# Patient Record
Sex: Male | Born: 1945
Health system: Southern US, Community
[De-identification: ages and names within clinical notes are randomized; demographics above are authoritative.]

## PROBLEM LIST (undated history)

## (undated) DIAGNOSIS — J449 Chronic obstructive pulmonary disease, unspecified: Secondary | ICD-10-CM

## (undated) DIAGNOSIS — I10 Essential (primary) hypertension: Secondary | ICD-10-CM

---

## 2016-06-07 ENCOUNTER — Emergency Department (HOSPITAL_COMMUNITY)
Admission: EM | Admit: 2016-06-07 | Discharge: 2016-06-07 | Disposition: A | Discharge status: Home / Self Care | Payer: Medicare Other | Attending: Emergency Medicine | Admitting: Emergency Medicine

## 2016-06-07 ENCOUNTER — Encounter (HOSPITAL_COMMUNITY): Payer: Self-pay | Admitting: Emergency Medicine

## 2016-06-07 DIAGNOSIS — I10 Essential (primary) hypertension: Secondary | ICD-10-CM | POA: Insufficient documentation

## 2016-06-07 DIAGNOSIS — Z87891 Personal history of nicotine dependence: Secondary | ICD-10-CM | POA: Diagnosis not present

## 2016-06-07 DIAGNOSIS — Z7982 Long term (current) use of aspirin: Secondary | ICD-10-CM | POA: Insufficient documentation

## 2016-06-07 DIAGNOSIS — J449 Chronic obstructive pulmonary disease, unspecified: Secondary | ICD-10-CM | POA: Diagnosis not present

## 2016-06-07 DIAGNOSIS — R040 Epistaxis: Secondary | ICD-10-CM | POA: Diagnosis not present

## 2016-06-07 DIAGNOSIS — Z79899 Other long term (current) drug therapy: Secondary | ICD-10-CM | POA: Diagnosis not present

## 2016-06-07 HISTORY — DX: Chronic obstructive pulmonary disease, unspecified: J44.9

## 2016-06-07 HISTORY — DX: Essential (primary) hypertension: I10

## 2016-06-07 MED ORDER — OXYMETAZOLINE HCL 0.05 % NA SOLN
1.0000 | Freq: Once | NASAL | Status: AC
  Administered 2016-06-07: 1 via NASAL
  Filled 2016-06-07: qty 15

## 2016-06-07 NOTE — ED Notes (Signed)
Pt made aware to return if symptoms worsen or if any life threatening symptoms occur.   

## 2016-06-07 NOTE — ED Triage Notes (Signed)
Per EMS: Pt reports nosebleed from left nare secondary to high bp starting 2 hours ago, pt wears 02 at home (3L). Pt usually takes 81mg  asa daily, but has not taken it in a week.  Pt has not taken bp medication today.  Denies pain. 207/97, 195/93, 99%, 76hr

## 2016-06-07 NOTE — Discharge Instructions (Signed)
Patient has begins to bleed again, apply constant pressure for 15-20 minutes and use 1 spray of Afrin. Please follow-up with your primary care provider about your recurrent nosebleeds. Make sure to use humidified air with your oxygen. Please return to the emergency department if you develop any new or worsening symptoms, or if you are unable to stop your nose from bleeding again.

## 2016-06-07 NOTE — ED Provider Notes (Signed)
[facility] DEPT Provider Note   CSN: [id number] Arrival date & time: 06/07/16  0800     History   Chief Complaint Chief Complaint  Patient presents with  . Epistaxis    HPI Jason Harvey is a 71 y.o. male history of COPD and hypertension who is on 2 L of home oxygen who presents with a two-hour history of intermittent epistaxis. Patient reports having epistaxis from his left nare. Patient was able to stop his nose from bleeding temporarily at home, but a clot moved andbegan bleeding again. Patient uses home oxygen and usually has humidified air, however he said the reservoir may have been empty. Patient reports he experiences epistaxis from time to time, but has never had to come to the hospital for it. He denies any pain. Denies any chest pain, new shortness of breath, abdominal pain, nausea vomiting, urinary symptoms.  HPI  Past Medical History:  Diagnosis Date  . COPD (chronic obstructive pulmonary disease) ([place])   . Hypertension     There are no active problems to display for this patient.   History reviewed. No pertinent surgical history.     Home Medications    Prior to Admission medications   Medication Sig Start Date End Date Taking? Authorizing Provider  amLODipine (NORVASC) 5 MG tablet Take 5 mg by mouth daily.   Yes Historical Provider, MD  aspirin EC 81 MG tablet Take 81 mg by mouth daily.   Yes Historical Provider, MD  azelastine (ASTELIN) 0.1 % nasal spray Place 1 spray into both nostrils 2 (two) times daily. Use in each nostril as directed   Yes Historical Provider, MD  budesonide-formoterol (SYMBICORT) 80-4.5 MCG/ACT inhaler Inhale 2 puffs into the lungs 2 (two) times daily.   Yes Historical Provider, MD  cloNIDine (CATAPRES) 0.3 MG tablet Take 0.3 mg by mouth 2 (two) times daily.   Yes Historical Provider, MD  latanoprost (XALATAN) 0.005 % ophthalmic solution Place 1 drop into both eyes at bedtime.   Yes Historical Provider, MD  tamsulosin (FLOMAX)  0.4 MG CAPS capsule Take 0.4 mg by mouth at bedtime.   Yes Historical Provider, MD    Family History History reviewed. No pertinent family history.  Social History Social History  Substance Use Topics  . Smoking status: Former Research scientist (life sciences)  . Smokeless tobacco: Not on file  . Alcohol use No     Allergies   Sulfa antibiotics   Review of Systems Review of Systems  Constitutional: Negative for chills and fever.  HENT: Positive for nosebleeds. Negative for facial swelling and sore throat.   Respiratory: Negative for shortness of breath.   Cardiovascular: Negative for chest pain.  Gastrointestinal: Negative for abdominal pain, nausea and vomiting.  Genitourinary: Negative for dysuria.  Musculoskeletal: Negative for back pain.  Skin: Negative for rash and wound.  Neurological: Negative for headaches.  Psychiatric/Behavioral: The patient is not nervous/anxious.      Physical Exam Updated Vital Signs BP 177/94 (BP Location: Left Arm)   Pulse 90   Temp 98.1 F (36.7 C) (Oral)   Resp 18   Ht 6\' 1"  (1.854 m)   Wt 129.3 kg   SpO2 94%   BMI 37.60 kg/m   Physical Exam  Constitutional: He appears well-developed and well-nourished. No distress.  HENT:  Head: Normocephalic and atraumatic.  Nose: Epistaxis (clot to left nare) is observed.  Mouth/Throat: Oropharynx is clear and moist. No oropharyngeal exudate.  Eyes: Conjunctivae are normal. Pupils are equal, round, and reactive to light. Right  eye exhibits no discharge. Left eye exhibits no discharge. No scleral icterus.  Neck: Normal range of motion. Neck supple. No thyromegaly present.  Cardiovascular: Normal rate, regular rhythm, normal heart sounds and intact distal pulses.  Exam reveals no gallop and no friction rub.   No murmur heard. Pulmonary/Chest: Effort normal and breath sounds normal. No stridor. No respiratory distress. He has no wheezes. He has no rales.  Abdominal: Soft. Bowel sounds are normal. He exhibits no  distension. There is no tenderness. There is no rebound and no guarding.  Musculoskeletal: He exhibits no edema.  Lymphadenopathy:    He has no cervical adenopathy.  Neurological: He is alert. Coordination normal.  Skin: Skin is warm and dry. No rash noted. He is not diaphoretic. No pallor.  Psychiatric: He has a normal mood and affect.  Nursing note and vitals reviewed.    ED Treatments / Results  Labs (all labs ordered are listed, but only abnormal results are displayed) Labs Reviewed - No data to display  EKG  EKG Interpretation None       Radiology No results found.  Procedures Procedures (including critical care time)  Medications Ordered in ED Medications  oxymetazoline (AFRIN) 0.05 % nasal spray 1 spray (1 spray Each Nare Given 06/07/16 0831)     Initial Impression / Assessment and Plan / ED Course  I have reviewed the triage vital signs and the nursing notes.  Pertinent labs & imaging results that were available during my care of the patient were reviewed by me and considered in my medical decision making (see chart for details).     Patient presenting with left-sided epistaxis. No active bleeding in the ED. I directed patient to blow his nose and a large clot was expressed. Afrin spray applied. No continued active bleeding. Patient discharged home with Afrin and directions to use in the future if nosebleeds began again. Patient advised not to use repetitively. Patient to follow up with PCP to discuss today's visit. Patient advised to make sure to use humidified air with home oxygen. Return precautions discussed. Patient understands and agrees with plan. Patient discharged in satisfactory condition without complaints. I discussed patient case with Dr. Thurnell Garbe who guided the patient's management and agrees with plan.  Final Clinical Impressions(s) / ED Diagnoses   Final diagnoses:  Epistaxis    New Prescriptions New Prescriptions   No medications on file      Frederica Kuster, Hershal Coria 06/07/16 Shafer, DO 06/09/16 1827

## 2017-03-25 ENCOUNTER — Ambulatory Visit: Payer: Medicare Other | Admitting: Cardiovascular Disease

## 2017-03-25 ENCOUNTER — Encounter: Payer: Self-pay | Admitting: Cardiovascular Disease

## 2017-03-25 ENCOUNTER — Encounter: Payer: Self-pay | Admitting: *Deleted

## 2017-03-25 ENCOUNTER — Telehealth: Payer: Self-pay | Admitting: Cardiovascular Disease

## 2017-03-25 VITALS — BP 160/58 | HR 60 | Ht 73.0 in | Wt 287.0 lb

## 2017-03-25 DIAGNOSIS — D696 Thrombocytopenia, unspecified: Secondary | ICD-10-CM | POA: Diagnosis not present

## 2017-03-25 DIAGNOSIS — I10 Essential (primary) hypertension: Secondary | ICD-10-CM

## 2017-03-25 DIAGNOSIS — R0609 Other forms of dyspnea: Secondary | ICD-10-CM | POA: Diagnosis not present

## 2017-03-25 DIAGNOSIS — R9431 Abnormal electrocardiogram [ECG] [EKG]: Secondary | ICD-10-CM | POA: Diagnosis not present

## 2017-03-25 DIAGNOSIS — N183 Chronic kidney disease, stage 3 unspecified: Secondary | ICD-10-CM

## 2017-03-25 DIAGNOSIS — D649 Anemia, unspecified: Secondary | ICD-10-CM

## 2017-03-25 NOTE — Progress Notes (Signed)
CARDIOLOGY CONSULT NOTE  Patient ID: Jason Harvey MRN: [id number] DOB/AGE: 01/24/1946 70 y.o.  Admit date: (Not on file) Primary Physician: Monico Blitz, MD Referring Physician: Dr. Manuella Ghazi  Reason for Consultation: Exertional dyspnea  HPI: Jason Harvey is a 71 y.o. male who is being seen today for the evaluation of exertional dyspnea at the request of Monico Blitz, MD.   He has a history of hypertension, oxygen dependent COPD, and bilateral leg edema.  I reviewed all records from his PCP including labs and studies.  Labs 02/24/17: BUN 25, creatinine 1.98, sodium 140, potassium 4.6, albumin 3.2, hemoglobin 9.2, platelets 64, white blood cells 9.2, ferritin 42, iron 34.  ECG on 03/06/17 demonstrated sinus rhythm with right bundle branch block and PVCs.  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  He apparently underwent an echocardiogram about 2 weeks ago but I do not have a copy of this report.  He was told he had some mild valve leakage due to age.  He has had some increasing exertional dyspnea over the past year.  He has occasional palpitations.  He denies chest pain.  I reviewed his home blood pressure log which demonstrate blood pressures ranging in the 120-130/60-70 range.  He does not exercise.  He takes care of his wife full-time.  He has not smoked cigarettes for the past 9 years.  He has bilateral leg edema which began in June.  He wears compression stockings.  He said he underwent a colonoscopy about 3 years ago and had 1 polyp removed.   Allergies  Allergen Reactions  . Sulfa Antibiotics Anxiety    Current Outpatient Medications  Medication Sig Dispense Refill  . amLODipine (NORVASC) 5 MG tablet Take 5 mg by mouth daily.    Marland Kitchen aspirin EC 81 MG tablet Take 81 mg by mouth daily.    Marland Kitchen azelastine (ASTELIN) 0.1 % nasal spray Place 1 spray into both nostrils 2 (two) times daily. Use in each nostril as directed      . budesonide-formoterol (SYMBICORT) 80-4.5 MCG/ACT inhaler Inhale 2 puffs into the lungs 2 (two) times daily.    . cloNIDine (CATAPRES) 0.3 MG tablet Take 0.3 mg by mouth 2 (two) times daily.    . furosemide (LASIX) 20 MG tablet Take 20 mg by mouth.    . hydrALAZINE (APRESOLINE) 25 MG tablet Take 25 mg 2 (two) times daily by mouth.     . latanoprost (XALATAN) 0.005 % ophthalmic solution Place 1 drop into both eyes at bedtime.    . potassium chloride (K-DUR) 10 MEQ tablet Take 10 mEq daily by mouth.    . tamsulosin (FLOMAX) 0.4 MG CAPS capsule Take 0.4 mg by mouth at bedtime.    Marland Kitchen UNABLE TO FIND Med Name:3L O2    . Vitamin D, Ergocalciferol, (DRISDOL) 50000 units CAPS capsule Take 50,000 Units every 7 (seven) days by mouth.     No current facility-administered medications for this visit.     Past Medical History:  Diagnosis Date  . COPD (chronic obstructive pulmonary disease) ([place])   . Hypertension     History reviewed. No pertinent surgical history.  Social History   Socioeconomic History  . Marital status: Married    Spouse name: Not on file  . Number of children: Not on file  . Years of education: Not on file  . Highest education level: Not on file  Social Needs  .  Financial resource strain: Not on file  . Food insecurity - worry: Not on file  . Food insecurity - inability: Not on file  . Transportation needs - medical: Not on file  . Transportation needs - non-medical: Not on file  Occupational History  . Not on file  Tobacco Use  . Smoking status: Former Research scientist (life sciences)  . Smokeless tobacco: Never Used  Substance and Sexual Activity  . Alcohol use: No  . Drug use: No  . Sexual activity: Not on file  Other Topics Concern  . Not on file  Social History Narrative  . Not on file     No family history of premature CAD in 1st degree relatives.  Current Meds  Medication Sig  . amLODipine (NORVASC) 5 MG tablet Take 5 mg by mouth daily.  Marland Kitchen aspirin EC 81 MG tablet Take 81  mg by mouth daily.  Marland Kitchen azelastine (ASTELIN) 0.1 % nasal spray Place 1 spray into both nostrils 2 (two) times daily. Use in each nostril as directed  . budesonide-formoterol (SYMBICORT) 80-4.5 MCG/ACT inhaler Inhale 2 puffs into the lungs 2 (two) times daily.  . cloNIDine (CATAPRES) 0.3 MG tablet Take 0.3 mg by mouth 2 (two) times daily.  . furosemide (LASIX) 20 MG tablet Take 20 mg by mouth.  . hydrALAZINE (APRESOLINE) 25 MG tablet Take 25 mg 2 (two) times daily by mouth.   . latanoprost (XALATAN) 0.005 % ophthalmic solution Place 1 drop into both eyes at bedtime.  . potassium chloride (K-DUR) 10 MEQ tablet Take 10 mEq daily by mouth.  . tamsulosin (FLOMAX) 0.4 MG CAPS capsule Take 0.4 mg by mouth at bedtime.  Marland Kitchen UNABLE TO FIND Med Name:3L O2  . Vitamin D, Ergocalciferol, (DRISDOL) 50000 units CAPS capsule Take 50,000 Units every 7 (seven) days by mouth.      Review of systems complete and found to be negative unless listed above in HPI    Physical exam Blood pressure (!) 160/58, pulse 60, height 6\' 1"  (1.854 m), weight 287 lb (130.2 kg), SpO2 98 %. General: NAD, using oxygen by nasal cannula. Neck: No JVD, no thyromegaly or thyroid nodule.  Lungs: Diminished throughout, no crackles or wheezes CV: Nondisplaced PMI. Regular rate and rhythm, normal S1/S2, no S3/S4, no murmur.  Bilateral lower extremity edema.  He is wearing compression stockings. Abdomen: Soft, nontender, no distention.  Skin: Intact without lesions or rashes.  Neurologic: Alert and oriented x 3.  Psych: Normal affect. Extremities: No clubbing or cyanosis.  HEENT: Normal.   ECG: Most recent ECG reviewed.   Labs: No results found for: K, BUN, CREATININE, ALT, TSH, HGB   Lipids: No results found for: LDLCALC, LDLDIRECT, CHOL, TRIG, HDL      ASSESSMENT AND PLAN:  1.  Exertional dyspnea and leg edema with abnormal ECG: He is both anemic and thrombocytopenic.  Exertional dyspnea may be related to his anemia.  I do  not know what his labs were 1 year ago to demonstrate a trend.  He apparently underwent a colonoscopy 3 years ago with no significant findings other than a polyp. He underwent an echocardiogram and I will try to obtain this report. I will obtain a Lexiscan Myoview stress test to assess for ischemic heart disease.  2.  Hypertension: Elevated in our office but blood pressure control at home is normal.  No changes to therapy.  3.  Anemia and thrombocytopenia: I am not certain if a peripheral smear was obtained.  I will defer further workup  to PCP.  He also has chronic kidney disease.   Disposition: Follow up in 2 months  Signed: Kate Sable, M.D., F.A.C.C.  03/25/2017, 10:14 AM

## 2017-03-25 NOTE — Telephone Encounter (Signed)
lexiscan - Jason Harvey, abnl ekg Scheduled at [facility] on Apr 02, 2017 arrive at [facility]

## 2017-03-25 NOTE — Patient Instructions (Signed)
Medication Instructions:  Continue all current medications.  Labwork: none  Testing/Procedures:  Your physician has requested that you have a lexiscan myoview. For further information please visit [email or web address]. Please follow instruction sheet, as given.  Office will contact with results via phone or letter.    Follow-Up: 2 months   Any Other Special Instructions Will Be Listed Below (If Applicable).  If you need a refill on your cardiac medications before your next appointment, please call your pharmacy.

## 2017-04-02 ENCOUNTER — Encounter (HOSPITAL_BASED_OUTPATIENT_CLINIC_OR_DEPARTMENT_OTHER)
Admission: RE | Admit: 2017-04-02 | Discharge: 2017-04-02 | Disposition: A | Discharge status: Home / Self Care | Payer: Medicare Other | Source: Ambulatory Visit | Attending: Cardiovascular Disease | Admitting: Cardiovascular Disease

## 2017-04-02 ENCOUNTER — Encounter (HOSPITAL_COMMUNITY)
Admission: RE | Admit: 2017-04-02 | Discharge: 2017-04-02 | Disposition: A | Discharge status: Home / Self Care | Payer: Medicare Other | Source: Ambulatory Visit | Attending: Cardiovascular Disease | Admitting: Cardiovascular Disease

## 2017-04-02 ENCOUNTER — Encounter (HOSPITAL_COMMUNITY): Payer: Self-pay

## 2017-04-02 ENCOUNTER — Telehealth: Payer: Self-pay | Admitting: *Deleted

## 2017-04-02 DIAGNOSIS — R9431 Abnormal electrocardiogram [ECG] [EKG]: Secondary | ICD-10-CM | POA: Insufficient documentation

## 2017-04-02 DIAGNOSIS — R0609 Other forms of dyspnea: Secondary | ICD-10-CM | POA: Diagnosis present

## 2017-04-02 LAB — NM MYOCAR MULTI W/SPECT W/WALL MOTION / EF
CSEPPHR: 93 {beats}/min
LVDIAVOL: 148 mL (ref 62–150)
LVSYSVOL: 69 mL
RATE: 0.38
Rest HR: 60 {beats}/min
SDS: 0
SRS: 1
SSS: 1
TID: 0.88

## 2017-04-02 MED ORDER — TECHNETIUM TC 99M TETROFOSMIN IV KIT
30.0000 | PACK | Freq: Once | INTRAVENOUS | Status: AC | PRN
  Administered 2017-04-02: 32 via INTRAVENOUS

## 2017-04-02 MED ORDER — TECHNETIUM TC 99M TETROFOSMIN IV KIT
10.0000 | PACK | Freq: Once | INTRAVENOUS | Status: AC | PRN
  Administered 2017-04-02: 9.9 via INTRAVENOUS

## 2017-04-02 MED ORDER — SODIUM CHLORIDE 0.9% FLUSH
INTRAVENOUS | Status: AC
  Administered 2017-04-02: 10 mL via INTRAVENOUS
  Filled 2017-04-02: qty 10

## 2017-04-02 MED ORDER — REGADENOSON 0.4 MG/5ML IV SOLN
INTRAVENOUS | Status: AC
  Administered 2017-04-02: 0.4 mg via INTRAVENOUS
  Filled 2017-04-02: qty 5

## 2017-04-02 NOTE — Telephone Encounter (Signed)
Notes recorded by Laurine Blazer, LPN on 68/04/7516 at 3:47 PM EST Patient notified. Copy to pmd. Follow up scheduled for 05/28/2017 with Dr. Bronson Ing. ------  Notes recorded by Herminio Commons, MD on 04/02/2017 at 12:34 PM EST Low risk for blockages.

## 2017-05-28 ENCOUNTER — Ambulatory Visit: Payer: Medicare Other | Admitting: Cardiovascular Disease

## 2017-05-28 ENCOUNTER — Encounter: Payer: Self-pay | Admitting: Cardiovascular Disease

## 2017-05-28 VITALS — BP 138/48 | HR 51 | Ht 73.0 in | Wt 266.0 lb

## 2017-05-28 DIAGNOSIS — R0609 Other forms of dyspnea: Secondary | ICD-10-CM | POA: Diagnosis not present

## 2017-05-28 DIAGNOSIS — D649 Anemia, unspecified: Secondary | ICD-10-CM | POA: Diagnosis not present

## 2017-05-28 DIAGNOSIS — R9431 Abnormal electrocardiogram [ECG] [EKG]: Secondary | ICD-10-CM | POA: Diagnosis not present

## 2017-05-28 DIAGNOSIS — I10 Essential (primary) hypertension: Secondary | ICD-10-CM

## 2017-05-28 NOTE — Progress Notes (Signed)
SUBJECTIVE: The patient returns for follow-up after undergoing cardiovascular testing performed for the evaluation of exertional dyspnea.  Nuclear stress test on 04/02/17 was low risk, LVEF 54%.  Echocardiogram on 03/03/17 demonstrated normal left ventricular systolic function and regional wall motion, LVEF 84-69%, with diastolic dysfunction.  He denies chest pain.  Exertional dyspnea is stable.  He was recently hospitalized for anemia.  He said his hemoglobin was 7.1 and he received 2 units packed red blood cell transfusion.  He has been wrapping his legs and elevating them at home and edema is subsiding.  He showed me a device his son brought him for Christmas which records your ECG Jodelle Red).    Review of Systems: As per "subjective", otherwise negative.  Allergies  Allergen Reactions  . Sulfa Antibiotics Anxiety    Current Outpatient Medications  Medication Sig Dispense Refill  . amLODipine (NORVASC) 5 MG tablet Take 5 mg by mouth daily.    Marland Kitchen aspirin EC 81 MG tablet Take 81 mg by mouth daily.    Marland Kitchen azelastine (ASTELIN) 0.1 % nasal spray Place 1 spray into both nostrils 2 (two) times daily. Use in each nostril as directed    . budesonide-formoterol (SYMBICORT) 80-4.5 MCG/ACT inhaler Inhale 2 puffs into the lungs 2 (two) times daily.    . cloNIDine (CATAPRES) 0.3 MG tablet Take 0.3 mg by mouth 2 (two) times daily.    . furosemide (LASIX) 20 MG tablet Take 20 mg by mouth.    . hydrALAZINE (APRESOLINE) 25 MG tablet Take 25 mg 2 (two) times daily by mouth.     . latanoprost (XALATAN) 0.005 % ophthalmic solution Place 1 drop into both eyes at bedtime.    . OXYGEN Inhale into the lungs. 3 Liters    . potassium chloride (K-DUR) 10 MEQ tablet Take 10 mEq daily by mouth.    . tamsulosin (FLOMAX) 0.4 MG CAPS capsule Take 0.4 mg by mouth at bedtime.    Marland Kitchen UNABLE TO FIND Med Name:3L O2    . Vitamin D, Ergocalciferol, (DRISDOL) 50000 units CAPS capsule Take 50,000 Units every 7 (seven)  days by mouth.     No current facility-administered medications for this visit.     Past Medical History:  Diagnosis Date  . COPD (chronic obstructive pulmonary disease) ([place])   . Hypertension     No past surgical history on file.  Social History   Socioeconomic History  . Marital status: Married    Spouse name: Not on file  . Number of children: Not on file  . Years of education: Not on file  . Highest education level: Not on file  Social Needs  . Financial resource strain: Not on file  . Food insecurity - worry: Not on file  . Food insecurity - inability: Not on file  . Transportation needs - medical: Not on file  . Transportation needs - non-medical: Not on file  Occupational History  . Not on file  Tobacco Use  . Smoking status: Former Research scientist (life sciences)  . Smokeless tobacco: Never Used  Substance and Sexual Activity  . Alcohol use: No  . Drug use: No  . Sexual activity: Not on file  Other Topics Concern  . Not on file  Social History Narrative  . Not on file     Vitals:   05/28/17 0822  BP: (!) 138/48  Pulse: (!) 51  SpO2: 98%  Weight: 266 lb (120.7 kg)  Height: 6\' 1"  (1.854 m)  Wt Readings from Last 3 Encounters:  05/28/17 266 lb (120.7 kg)  03/25/17 287 lb (130.2 kg)  06/07/16 285 lb (129.3 kg)     PHYSICAL EXAM General: NAD HEENT: Normal. Neck: No JVD, no thyromegaly. Lungs: Diminished throughout, no crackles or wheezes. CV: Regular rate and rhythm, normal S1/S2, no S3/S4, no murmur.  Legs are wrapped. Abdomen: Soft, nontender, no distention.  Neurologic: Alert and oriented.  Psych: Normal affect. Skin: Normal. Musculoskeletal: No gross deformities.    ECG: Most recent ECG reviewed.   Labs: No results found for: K, BUN, CREATININE, ALT, TSH, HGB   Lipids: No results found for: LDLCALC, LDLDIRECT, CHOL, TRIG, HDL     ASSESSMENT AND PLAN:  1.  Exertional dyspnea and leg edema with abnormal ECG: Nuclear stress test was low risk as  detailed above.  Left ventricular systolic function was normal.  No further cardiac testing is indicated at this time.  2.  Hypertension: Controlled.  No change to therapy.   Disposition: Follow up prn   Kate Sable, M.D., F.A.C.C.

## 2017-05-28 NOTE — Patient Instructions (Signed)
Medication Instructions:  Continue all current medications.  Labwork: none  Testing/Procedures: none  Follow-Up: As needed.    Any Other Special Instructions Will Be Listed Below (If Applicable).  If you need a refill on your cardiac medications before your next appointment, please call your pharmacy.  

## 2017-06-24 ENCOUNTER — Encounter: Payer: Self-pay | Admitting: Internal Medicine

## 2017-07-25 ENCOUNTER — Other Ambulatory Visit (HOSPITAL_COMMUNITY): Payer: Self-pay | Admitting: Oncology

## 2017-07-25 DIAGNOSIS — C155 Malignant neoplasm of lower third of esophagus: Secondary | ICD-10-CM

## 2017-08-05 ENCOUNTER — Ambulatory Visit (HOSPITAL_COMMUNITY)
Admission: RE | Admit: 2017-08-05 | Discharge: 2017-08-05 | Disposition: A | Discharge status: Home / Self Care | Payer: Medicare Other | Source: Ambulatory Visit | Attending: Oncology | Admitting: Oncology

## 2017-08-05 DIAGNOSIS — C155 Malignant neoplasm of lower third of esophagus: Secondary | ICD-10-CM

## 2017-08-05 DIAGNOSIS — R918 Other nonspecific abnormal finding of lung field: Secondary | ICD-10-CM | POA: Diagnosis not present

## 2017-08-05 LAB — GLUCOSE, CAPILLARY: Glucose-Capillary: 128 mg/dL — ABNORMAL HIGH (ref 65–99)

## 2017-08-05 MED ORDER — FLUDEOXYGLUCOSE F - 18 (FDG) INJECTION
13.4300 | Freq: Once | INTRAVENOUS | Status: AC | PRN
  Administered 2017-08-05: 13.43 via INTRAVENOUS

## 2017-08-07 ENCOUNTER — Ambulatory Visit: Payer: Medicare Other | Admitting: Nurse Practitioner

## 2018-06-09 ENCOUNTER — Emergency Department (HOSPITAL_COMMUNITY)
Admission: EM | Admit: 2018-06-09 | Discharge: 2018-06-09 | Disposition: A | Discharge status: Home / Self Care | Payer: Medicare Other | Attending: Emergency Medicine | Admitting: Emergency Medicine

## 2018-06-09 ENCOUNTER — Encounter (HOSPITAL_COMMUNITY): Payer: Self-pay | Admitting: Emergency Medicine

## 2018-06-09 ENCOUNTER — Other Ambulatory Visit: Payer: Self-pay

## 2018-06-09 DIAGNOSIS — Z87891 Personal history of nicotine dependence: Secondary | ICD-10-CM | POA: Diagnosis not present

## 2018-06-09 DIAGNOSIS — Z7982 Long term (current) use of aspirin: Secondary | ICD-10-CM | POA: Insufficient documentation

## 2018-06-09 DIAGNOSIS — I10 Essential (primary) hypertension: Secondary | ICD-10-CM | POA: Diagnosis not present

## 2018-06-09 DIAGNOSIS — J449 Chronic obstructive pulmonary disease, unspecified: Secondary | ICD-10-CM | POA: Diagnosis not present

## 2018-06-09 DIAGNOSIS — R040 Epistaxis: Secondary | ICD-10-CM | POA: Diagnosis present

## 2018-06-09 DIAGNOSIS — Z79899 Other long term (current) drug therapy: Secondary | ICD-10-CM | POA: Diagnosis not present

## 2018-06-09 MED ORDER — CEPHALEXIN 500 MG PO CAPS: 500.0000 mg | ORAL_CAPSULE | Freq: Two times a day (BID) | ORAL | 0 refills | Status: DC

## 2018-06-09 MED ORDER — OXYMETAZOLINE HCL 0.05 % NA SOLN
1.0000 | Freq: Once | NASAL | Status: AC
  Administered 2018-06-09: 1 via NASAL
  Filled 2018-06-09: qty 15

## 2018-06-09 NOTE — ED Provider Notes (Signed)
[facility] EMERGENCY DEPARTMENT Provider Note   CSN: [id number] Arrival date & time: 06/09/18  0101     History   Chief Complaint No chief complaint on file.   HPI Jason Harvey is a 73 y.o. male.  Patient presents to the emergency department with a chief complaint of epistaxis.  He states the symptoms started about 8 PM tonight.  He states that the bleeding has been coming from his left nostril.  He denies any trauma.  He reports having had nosebleeds similar to this in the past.  He has never required nasal packing.  He is not anticoagulated.  He has tried using nose sprays prior to arrival with no relief.  The history is provided by the patient. No language interpreter was used.    Past Medical History:  Diagnosis Date  . COPD (chronic obstructive pulmonary disease) ([place])   . Hypertension     There are no active problems to display for this patient.   History reviewed. No pertinent surgical history.      Home Medications    Prior to Admission medications   Medication Sig Start Date End Date Taking? Authorizing Provider  amLODipine (NORVASC) 5 MG tablet Take 5 mg by mouth daily.    [provider]  aspirin EC 81 MG tablet Take 81 mg by mouth daily.    [provider]  azelastine (ASTELIN) 0.1 % nasal spray Place 1 spray into both nostrils 2 (two) times daily. Use in each nostril as directed    [provider]  budesonide-formoterol (SYMBICORT) 80-4.5 MCG/ACT inhaler Inhale 2 puffs into the lungs 2 (two) times daily.    [provider]  cloNIDine (CATAPRES) 0.3 MG tablet Take 0.3 mg by mouth 2 (two) times daily.    [provider]  furosemide (LASIX) 20 MG tablet Take 20 mg by mouth.    [provider]  hydrALAZINE (APRESOLINE) 25 MG tablet Take 25 mg 2 (two) times daily by mouth.     [provider]  latanoprost (XALATAN) 0.005 % ophthalmic solution Place 1 drop into both eyes at  bedtime.    [provider]  OXYGEN Inhale into the lungs. 3 Liters    [provider]  potassium chloride (K-DUR) 10 MEQ tablet Take 10 mEq daily by mouth.    [provider]  tamsulosin (FLOMAX) 0.4 MG CAPS capsule Take 0.4 mg by mouth at bedtime.    [provider]  UNABLE TO FIND Med Name:3L O2    [provider]  Vitamin D, Ergocalciferol, (DRISDOL) 50000 units CAPS capsule Take 50,000 Units every 7 (seven) days by mouth.    [provider]    Family History Family History  Problem Relation Age of Onset  . Hypertension Father     Social History Social History   Tobacco Use  . Smoking status: Former Research scientist (life sciences)  . Smokeless tobacco: Never Used  Substance Use Topics  . Alcohol use: No  . Drug use: No     Allergies   Sulfa antibiotics   Review of Systems Review of Systems  All other systems reviewed and are negative.    Physical Exam Updated Vital Signs BP (!) 179/86   Pulse 91   Temp 98.4 F (36.9 C) (Oral)   Resp 18   Ht 6\' 1"  (1.854 m)   Wt 112 kg   SpO2 97%   BMI 32.59 kg/m   Physical Exam Vitals signs and nursing note  reviewed.  Constitutional:      Appearance: He is well-developed.  HENT:     Head: Normocephalic and atraumatic.     Nose:     Comments: Bleeding from left nostril, source not well visualized due to bleeding Eyes:     Conjunctiva/sclera: Conjunctivae normal.  Neck:     Musculoskeletal: Normal range of motion.  Cardiovascular:     Rate and Rhythm: Normal rate.  Pulmonary:     Effort: Pulmonary effort is normal.  Abdominal:     General: There is no distension.  Musculoskeletal: Normal range of motion.  Skin:    General: Skin is dry.  Neurological:     Mental Status: He is alert and oriented to person, place, and time.  Psychiatric:        Behavior: Behavior normal.        Thought Content: Thought content normal.        Judgment: Judgment normal.      ED Treatments /  Results  Labs (all labs ordered are listed, but only abnormal results are displayed) Labs Reviewed - No data to display  EKG None  Radiology No results found.  Procedures .Epistaxis Management Date/Time: 06/09/2018 1:50 AM Performed by: Montine Circle, PA-C Authorized by: Montine Circle, PA-C   Consent:    Consent obtained:  Verbal   Consent given by:  Patient   Risks discussed:  Bleeding, infection, nasal injury and pain   Alternatives discussed:  Observation Anesthesia (see MAR for exact dosages):    Anesthesia method:  None Procedure details:    Treatment site:  L anterior   Treatment method:  Merocel sponge   Treatment complexity:  Limited   Treatment episode: recurring   Post-procedure details:    Assessment:  Bleeding decreased   Patient tolerance of procedure:  Tolerated well, no immediate complications   (including critical care time)  Medications Ordered in ED Medications  oxymetazoline (AFRIN) 0.05 % nasal spray 1 spray (1 spray Each Nare Given 06/09/18 0117)     Initial Impression / Assessment and Plan / ED Course  I have reviewed the triage vital signs and the nursing notes.  Pertinent labs & imaging results that were available during my care of the patient were reviewed by me and considered in my medical decision making (see chart for details).     Patient with bleeding from his left nares.  Not anticoagulated.  Denies trauma.    Initial treatment with afrin unsuccessful.  Tried packing with rapid rhino, but this had some leaking and dripping down the back of his throat.  I removed this and placed a 10cm merocel packing.    4:11 AM Patient has had persistent very slow dripping of blood down his throat.  I discussed this with ENT Dr. Wilburn Cornelia, who states that likely adequately controlled given that the bleeding is so minor now compared to original presentation.  Recommends follow-up in his office.  Will give keflex.  Patient will call ENT office  this morning.  Patient understands and agrees with the plan.  Final Clinical Impressions(s) / ED Diagnoses   Final diagnoses:  Epistaxis    ED Discharge Orders    None       Montine Circle, PA-C 06/09/18 0415    Ripley Fraise, MD 06/09/18 [phone]

## 2018-06-09 NOTE — Discharge Instructions (Addendum)
Please call the ENT office today for an appointment if the slow dripping remains.  If it stops, please call and make an appointment for in 3 days.  If it worsens, return to the ER.

## 2018-06-09 NOTE — ED Notes (Signed)
ED Provider at bedside. 

## 2018-06-09 NOTE — ED Provider Notes (Signed)
Patient seen/examined in the Emergency Department in conjunction with Midlevel Provider [facility] Patient reports epistaxis Exam : awake/alert, packing noted in left nare Plan: plan to monitor to ensure improvement prior to discharge     Ripley Fraise, MD 06/09/18 [phone]

## 2018-06-09 NOTE — ED Triage Notes (Signed)
Pt reports nosebleed beginning around 2000 tonight. Unsuccessful with any attempts to control hemorrhage.

## 2019-02-17 ENCOUNTER — Encounter: Payer: Medicare Other | Admitting: Neurology

## 2019-03-31 ENCOUNTER — Encounter: Payer: Medicare Other | Admitting: Neurology

## 2019-07-28 DIAGNOSIS — J449 Chronic obstructive pulmonary disease, unspecified: Secondary | ICD-10-CM | POA: Diagnosis not present

## 2019-07-28 DIAGNOSIS — R0602 Shortness of breath: Secondary | ICD-10-CM | POA: Diagnosis not present

## 2019-07-29 DIAGNOSIS — J449 Chronic obstructive pulmonary disease, unspecified: Secondary | ICD-10-CM | POA: Diagnosis not present

## 2019-07-29 DIAGNOSIS — R0602 Shortness of breath: Secondary | ICD-10-CM | POA: Diagnosis not present

## 2019-08-02 DIAGNOSIS — R29898 Other symptoms and signs involving the musculoskeletal system: Secondary | ICD-10-CM | POA: Diagnosis not present

## 2019-08-02 DIAGNOSIS — C9000 Multiple myeloma not having achieved remission: Secondary | ICD-10-CM | POA: Diagnosis not present

## 2019-08-02 DIAGNOSIS — Z8579 Personal history of other malignant neoplasms of lymphoid, hematopoietic and related tissues: Secondary | ICD-10-CM | POA: Diagnosis not present

## 2019-08-02 DIAGNOSIS — G5603 Carpal tunnel syndrome, bilateral upper limbs: Secondary | ICD-10-CM | POA: Diagnosis not present

## 2019-08-02 DIAGNOSIS — G609 Hereditary and idiopathic neuropathy, unspecified: Secondary | ICD-10-CM | POA: Diagnosis not present

## 2019-08-02 DIAGNOSIS — G629 Polyneuropathy, unspecified: Secondary | ICD-10-CM | POA: Diagnosis not present

## 2019-08-02 DIAGNOSIS — R200 Anesthesia of skin: Secondary | ICD-10-CM | POA: Diagnosis not present

## 2019-08-04 DIAGNOSIS — K529 Noninfective gastroenteritis and colitis, unspecified: Secondary | ICD-10-CM | POA: Diagnosis not present

## 2019-08-04 DIAGNOSIS — F17210 Nicotine dependence, cigarettes, uncomplicated: Secondary | ICD-10-CM | POA: Diagnosis not present

## 2019-08-04 DIAGNOSIS — Z299 Encounter for prophylactic measures, unspecified: Secondary | ICD-10-CM | POA: Diagnosis not present

## 2019-08-04 DIAGNOSIS — I10 Essential (primary) hypertension: Secondary | ICD-10-CM | POA: Diagnosis not present

## 2019-08-09 ENCOUNTER — Encounter (HOSPITAL_COMMUNITY): Payer: Self-pay | Admitting: *Deleted

## 2019-08-09 ENCOUNTER — Emergency Department (HOSPITAL_COMMUNITY)
Admission: EM | Admit: 2019-08-09 | Discharge: 2019-08-09 | Disposition: A | Discharge status: Home / Self Care | Payer: Medicare PPO | Attending: Emergency Medicine | Admitting: Emergency Medicine

## 2019-08-09 DIAGNOSIS — R040 Epistaxis: Secondary | ICD-10-CM | POA: Insufficient documentation

## 2019-08-09 DIAGNOSIS — Z87891 Personal history of nicotine dependence: Secondary | ICD-10-CM | POA: Diagnosis not present

## 2019-08-09 DIAGNOSIS — I10 Essential (primary) hypertension: Secondary | ICD-10-CM | POA: Insufficient documentation

## 2019-08-09 DIAGNOSIS — J449 Chronic obstructive pulmonary disease, unspecified: Secondary | ICD-10-CM | POA: Insufficient documentation

## 2019-08-09 DIAGNOSIS — Z79899 Other long term (current) drug therapy: Secondary | ICD-10-CM | POA: Diagnosis not present

## 2019-08-09 MED ORDER — CEPHALEXIN 250 MG PO CAPS: 250.0000 mg | ORAL_CAPSULE | Freq: Two times a day (BID) | ORAL | 0 refills | Status: AC

## 2019-08-09 MED ORDER — LIDOCAINE-EPINEPHRINE-TETRACAINE (LET) TOPICAL GEL
3.0000 mL | Freq: Once | TOPICAL | Status: AC
  Administered 2019-08-09: 3 mL via TOPICAL
  Filled 2019-08-09: qty 3

## 2019-08-09 MED ORDER — CEPHALEXIN 250 MG PO CAPS
250.0000 mg | ORAL_CAPSULE | Freq: Once | ORAL | Status: AC
  Administered 2019-08-09: 250 mg via ORAL
  Filled 2019-08-09: qty 1

## 2019-08-09 NOTE — ED Provider Notes (Addendum)
[facility] EMERGENCY DEPARTMENT Provider Note   CSN: [id number] Arrival date & time: 08/09/19  1531     History Chief Complaint  Patient presents with  . Epistaxis    Jason Harvey is a 74 y.o. male presenting for evaluation of nosebleed.  Patient states he developed bleeding from his left nare yesterday.  It was intermittent yesterday, but persistent today.  He has used Afrin at home without improvement of symptoms.  He reports history of similar, has needed packing in the past.  He followed up with Dr. Redmond Baseman last year after nosebleed, and no cause for the bleeding was identified.  He is not on anticoagulation.  He denies trauma or injury.  He denies fevers, chills, sore throat, cough, or URI symptoms.  HPI     Past Medical History:  Diagnosis Date  . COPD (chronic obstructive pulmonary disease) ([place])   . Hypertension     There are no problems to display for this patient.   History reviewed. No pertinent surgical history.     Family History  Problem Relation Age of Onset  . Hypertension Father     Social History   Tobacco Use  . Smoking status: Former Research scientist (life sciences)  . Smokeless tobacco: Never Used  Substance Use Topics  . Alcohol use: No  . Drug use: No    Home Medications Prior to Admission medications   Medication Sig Start Date End Date Taking? Authorizing Provider  amLODipine (NORVASC) 5 MG tablet Take 5 mg by mouth daily.    [provider]  budesonide-formoterol (SYMBICORT) 80-4.5 MCG/ACT inhaler Inhale 2 puffs into the lungs 2 (two) times daily.    [provider]  Calcium Carbonate-Vitamin D (CALCIUM HIGH POTENCY/VITAMIN D) 600-200 MG-UNIT TABS Take 1 tablet by mouth daily.    [provider]  cephALEXin (KEFLEX) 250 MG capsule Take 1 capsule (250 mg total) by mouth 2 (two) times daily for 4 days. 08/09/19 08/13/19  Zanobia Griebel, PA-C  cloNIDine (CATAPRES) 0.3 MG tablet Take 0.3 mg by mouth 2 (two)  times daily.    [provider]  furosemide (LASIX) 20 MG tablet Take 40 mg by mouth every morning.     [provider]  hydrALAZINE (APRESOLINE) 25 MG tablet Take 50 mg by mouth 2 (two) times daily.     [provider]  latanoprost (XALATAN) 0.005 % ophthalmic solution Place 1 drop into both eyes at bedtime.    [provider]  omeprazole (PRILOSEC) 40 MG capsule Take 40 mg by mouth 2 (two) times daily. 10/22/17   [provider]  OXYGEN Inhale 3 L into the lungs continuous. 3 Liters     [provider]  potassium chloride (K-DUR) 10 MEQ tablet Take 10 mEq daily by mouth.    [provider]  tamsulosin (FLOMAX) 0.4 MG CAPS capsule Take 0.4 mg by mouth at bedtime.    [provider]  Vitamin D, Ergocalciferol, (DRISDOL) 50000 units CAPS capsule Take 50,000 Units every 7 (seven) days by mouth.    [provider]    Allergies    Sulfa antibiotics  Review of Systems   Review of Systems  Constitutional: Negative for fever.  HENT: Positive for nosebleeds.     Physical Exam Updated Vital Signs BP (!) 153/60 (BP Location: Left Arm)   Pulse 94   Resp 18   Ht 6\' 1"  (1.854 m)   Wt 108 kg   SpO2 92%   BMI 31.40  kg/m   Physical Exam Vitals and nursing note reviewed.  Constitutional:      Appearance: He is well-developed.     Comments: Chronically ill-appearing  HENT:     Head: Normocephalic and atraumatic.     Nose:     Left Nostril: Epistaxis present.     Comments: Large clot in the left nare.  When removed, patient with slow dripping ooze from the left nare.  Unable to visualize source due to bleeding. Pulmonary:     Effort: Pulmonary effort is normal.  Abdominal:     General: There is no distension.  Musculoskeletal:        General: Normal range of motion.     Cervical back: Normal range of motion.  Skin:    General: Skin is warm.     Findings: No rash.  Neurological:     Mental Status: He is  alert and oriented to person, place, and time.     ED Results / Procedures / Treatments   Labs (all labs ordered are listed, but only abnormal results are displayed) Labs Reviewed - No data to display  EKG None  Radiology No results found.  Procedures .Epistaxis Management  Date/Time: 08/21/2019 1:19 AM Performed by: Franchot Heidelberg, PA-C Authorized by: Franchot Heidelberg, PA-C   Consent:    Consent obtained:  Verbal   Consent given by:  Patient   Risks discussed:  Bleeding, infection, nasal injury and pain   Alternatives discussed:  Alternative treatment Anesthesia (see MAR for exact dosages):    Anesthesia method:  Topical application   Topical anesthetic:  LET Procedure details:    Treatment site:  Unable to specify   Treatment method:  Merocel sponge   Treatment complexity:  Extensive   Treatment episode: recurring   Post-procedure details:    Assessment:  Bleeding decreased   Patient tolerance of procedure:  Tolerated well, no immediate complications   (including critical care time)  Medications Ordered in ED Medications  lidocaine-EPINEPHrine-tetracaine (LET) topical gel (has no administration in time range)  cephALEXin (KEFLEX) capsule 250 mg (has no administration in time range)    ED Course  I have reviewed the triage vital signs and the nursing notes.  Pertinent labs & imaging results that were available during my care of the patient were reviewed by me and considered in my medical decision making (see chart for details).    MDM Rules/Calculators/A&P                      Patient presented for evaluation of epistaxis.  On exam, patient appears nontoxic.  He has a large clot from the left nare with minimal continued bleeding.  When removed, he has mild slow ooze.  He has already used Afrin at home without improvement.  Upon review of last ER visit, patient required Merocel packing, did not tolerate Rhino Rocket.  Discussed option of attempted control  with repeat dose of Afrin versus going straight packing.  Patient would like to try packing.  Merocel 10 cm covered in LET to help with pain and bleeding control.  Patient tolerated procedure well.  On reassessment, patient reports he still feels he is having some blood dripping down his throat.  However on my evaluation, cannot visualize any OP drainage.  Patient did not cough up any bloody mucus at that time.  Will consult with ENT.  Discussed with Dr. Redmond Baseman from ENT who recommends outpatient follow-up in 4 days.  Discussed findings and plan  with patient and son.  Discussed prompt return to the ER and/or ENT as needed if symptoms worsen.  At this time, patient appears safe for discharge.  Return precautions given.  Patient and son state they understand and agree to plan.  Final Clinical Impression(s) / ED Diagnoses Final diagnoses:  Epistaxis    Rx / DC Orders ED Discharge Orders         Ordered    cephALEXin (KEFLEX) 250 MG capsule  2 times daily     08/09/19 2115           Franchot Heidelberg, PA-C 08/09/19 2152    Davonna Belling, MD 08/09/19 Wilkin, Robertlee Rogacki, PA-C 08/21/19 Jyl Heinz, MD 08/21/19 [phone]

## 2019-08-09 NOTE — ED Triage Notes (Signed)
To ED for eval of nosebleed that started yesterday. Pt has hx of same. On O2 [facility] chronically. States last nosebleed in 2019 and seen by Dr Redmond Baseman. Bleeding is very slow at this time - few drops every a few min. Afrin tried at home. Ice pack given to place on bridge of nose in triage. Airway patent. Pt without pain.

## 2019-08-09 NOTE — ED Notes (Signed)
Pt in restroom with family member.

## 2019-08-09 NOTE — Discharge Instructions (Signed)
Keep packing in until you follow up with the ear nose and throat doctor Continue taking home medications as prescribed.  Take keflex as prescribed.  Return to the ER if you develop fevers, worsening bleeding, or any new, worsening, or concerning symptoms.

## 2019-08-13 DIAGNOSIS — R040 Epistaxis: Secondary | ICD-10-CM | POA: Diagnosis not present

## 2019-08-28 DIAGNOSIS — J449 Chronic obstructive pulmonary disease, unspecified: Secondary | ICD-10-CM | POA: Diagnosis not present

## 2019-08-28 DIAGNOSIS — R0602 Shortness of breath: Secondary | ICD-10-CM | POA: Diagnosis not present

## 2019-08-29 DIAGNOSIS — J449 Chronic obstructive pulmonary disease, unspecified: Secondary | ICD-10-CM | POA: Diagnosis not present

## 2019-08-29 DIAGNOSIS — R0602 Shortness of breath: Secondary | ICD-10-CM | POA: Diagnosis not present

## 2019-08-30 DIAGNOSIS — D509 Iron deficiency anemia, unspecified: Secondary | ICD-10-CM | POA: Diagnosis not present

## 2019-08-30 DIAGNOSIS — N179 Acute kidney failure, unspecified: Secondary | ICD-10-CM | POA: Diagnosis not present

## 2019-08-30 DIAGNOSIS — C9000 Multiple myeloma not having achieved remission: Secondary | ICD-10-CM | POA: Diagnosis not present

## 2019-08-30 DIAGNOSIS — I10 Essential (primary) hypertension: Secondary | ICD-10-CM | POA: Diagnosis not present

## 2019-09-05 IMAGING — PT NM PET TUM IMG INITIAL (PI) SKULL BASE T - THIGH
8 series · 25 of 25 positions shown · non-contrast
Comparison: None.

CLINICAL DATA: Initial treatment strategy for esophageal cancer.

EXAM:
NUCLEAR MEDICINE PET SKULL BASE TO THIGH
TECHNIQUE: 13.4 mCi F-18 FDG was injected intravenously. Full-ring PET imaging
was performed from the skull base to thigh after the radiotracer. CT
data was obtained and used for attenuation correction and anatomic
localization.
Fasting blood glucose: 128 mg/dl
Mediastinal blood pool activity: SUV max

[Series 3: pet sk_thigh ac · axial · 5.0mm · 4.07mm/px · z∈[-1443,-583]mm · 4 of 216 slices shown]
[im 1/216]
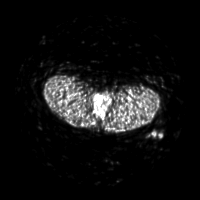
[im 72/216]
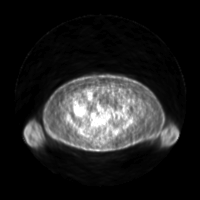
[im 144/216]
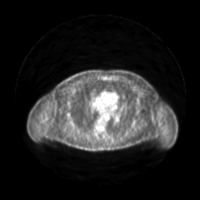
[im 216/216]
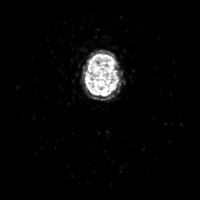

[Series 4: ct sk_thigh 5.0 hd_fov · axial · 5.0mm · 1.23mm/px · z∈[-1443,-583]mm · 5 of 216 slices shown]
[im 1/216]
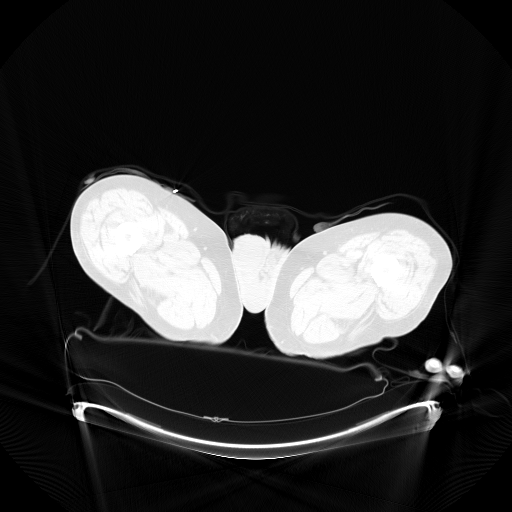
[im 54/216]
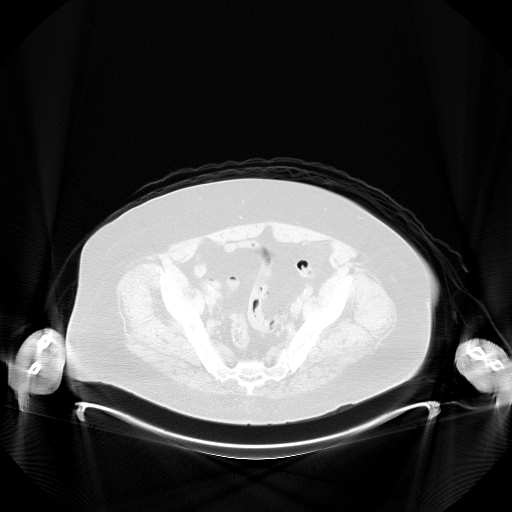
[im 108/216]
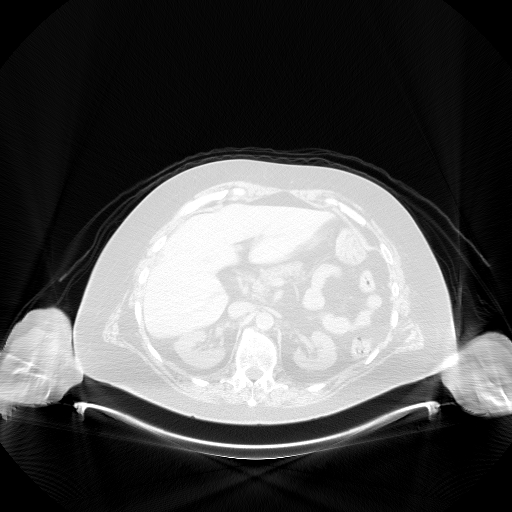
[im 162/216]
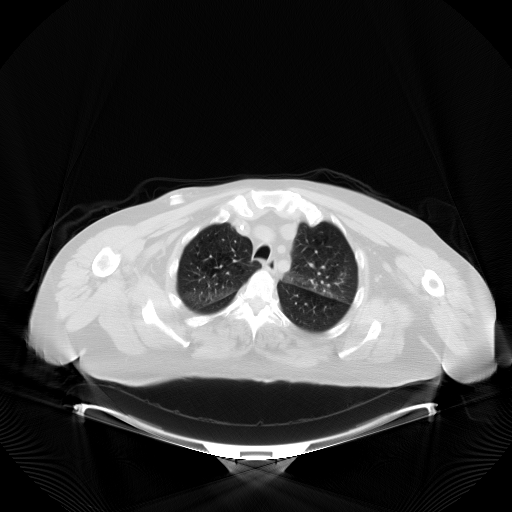
[im 216/216]
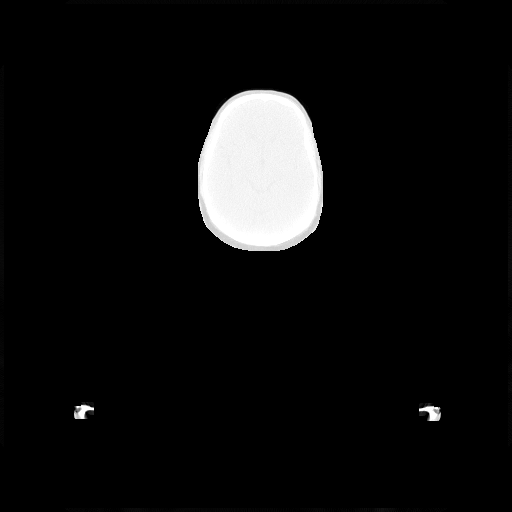

[Series 5: pet sk_thigh nac · axial · 5.0mm · 4.07mm/px · z∈[-1443,-583]mm · 5 of 216 slices shown]
[im 1/216]
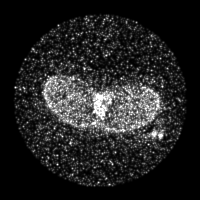
[im 54/216]
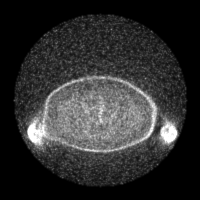
[im 108/216]
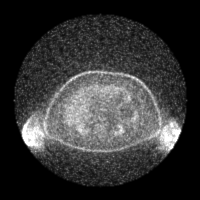
[im 162/216]
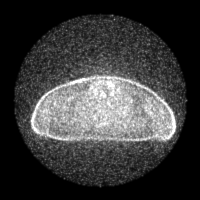
[im 216/216]
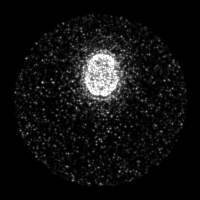

[Series 8: ct sk_thigh 5.0 b70f lung_bone · axial · 5.0mm · 0.71mm/px · 1 of 63 slices shown]
[im 1/63  bone]
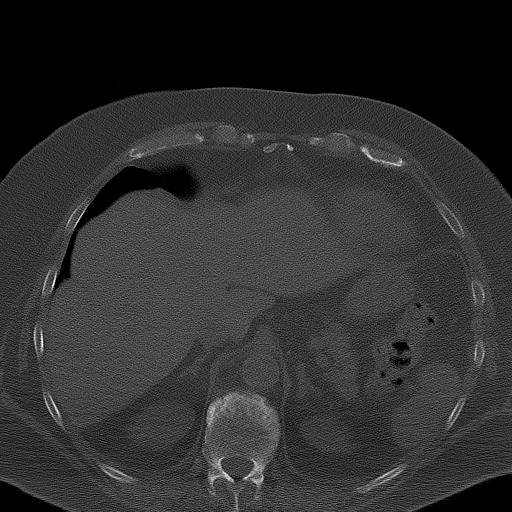

[Series 604: mip range 2 · coronal · 1.79mm/px · 1 of 32 slices shown]
[im 1/32]
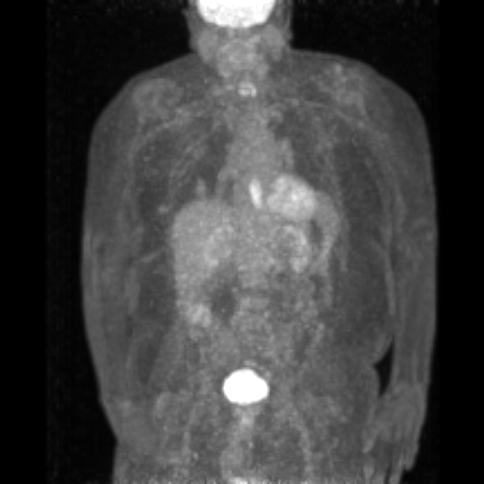

[Series 605: range-ct sk_thigh 5.0 hd_fov-cor-<alpha range> · 3 of 109 slices shown]
[im 1/109]
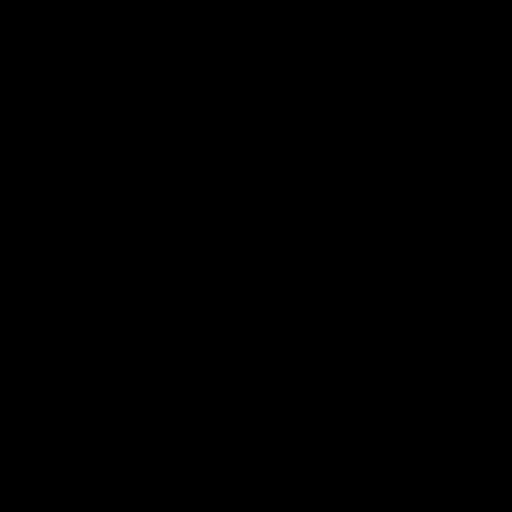
[im 55/109]
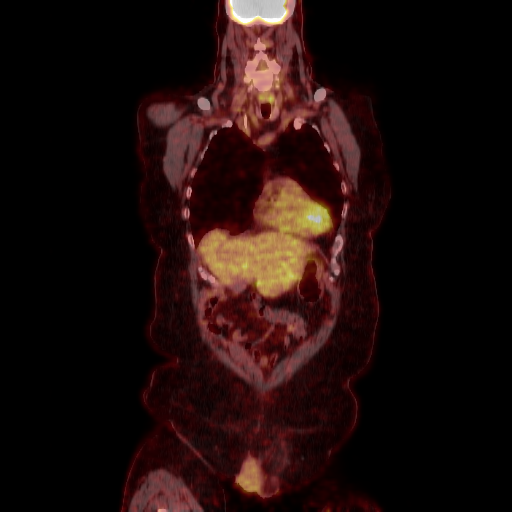
[im 109/109]
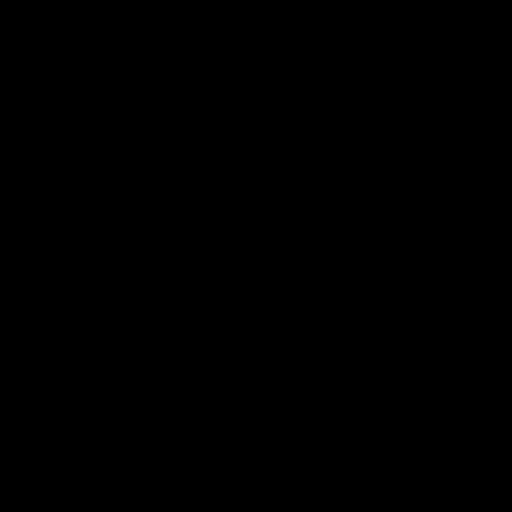

[Series 606: range-ct sk_thigh 5.0 hd_fov-tra-<alpha range> · 5 of 206 slices shown]
[im 1/206]
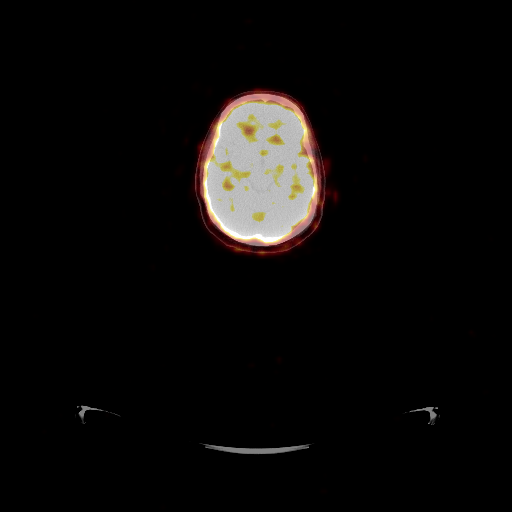
[im 52/206]
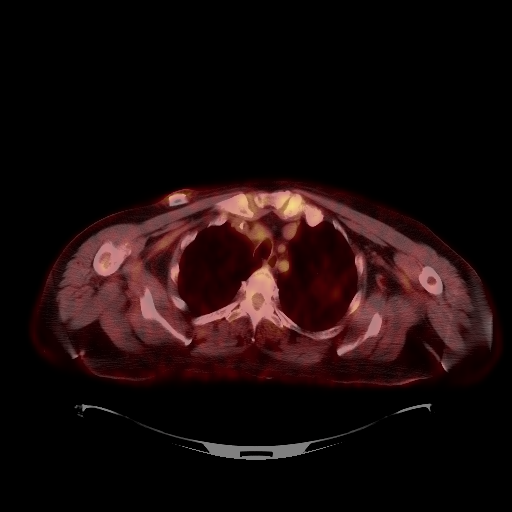
[im 103/206]
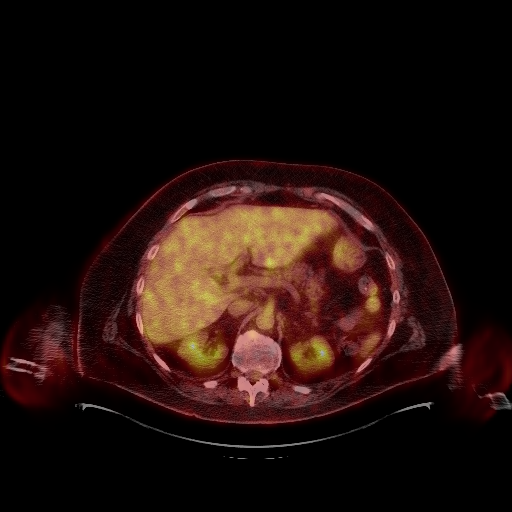
[im 154/206]
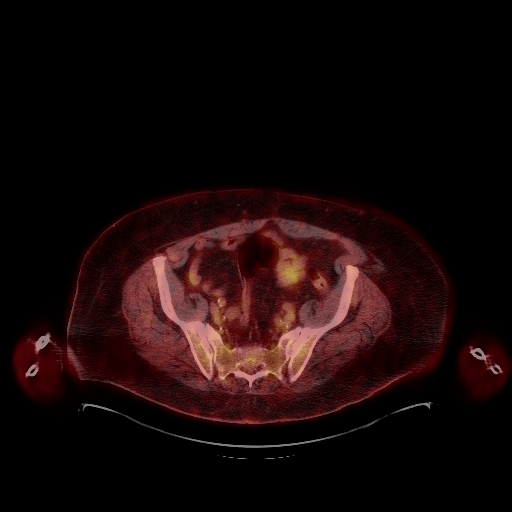
[im 206/206]
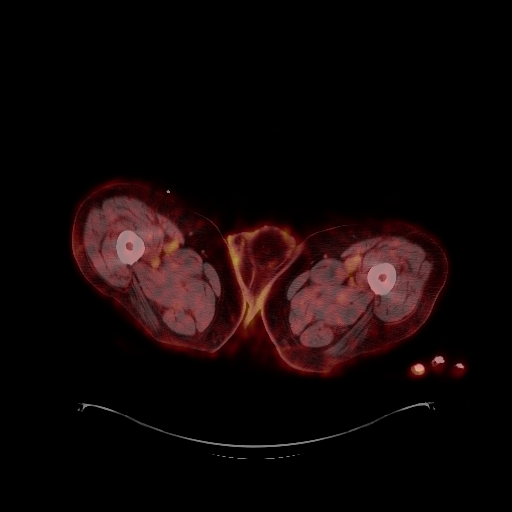

[Series 1077: results mm oncology reading · 1.2mm · 1.13mm/px · 1 of 2 slices shown]
[im 1/2]
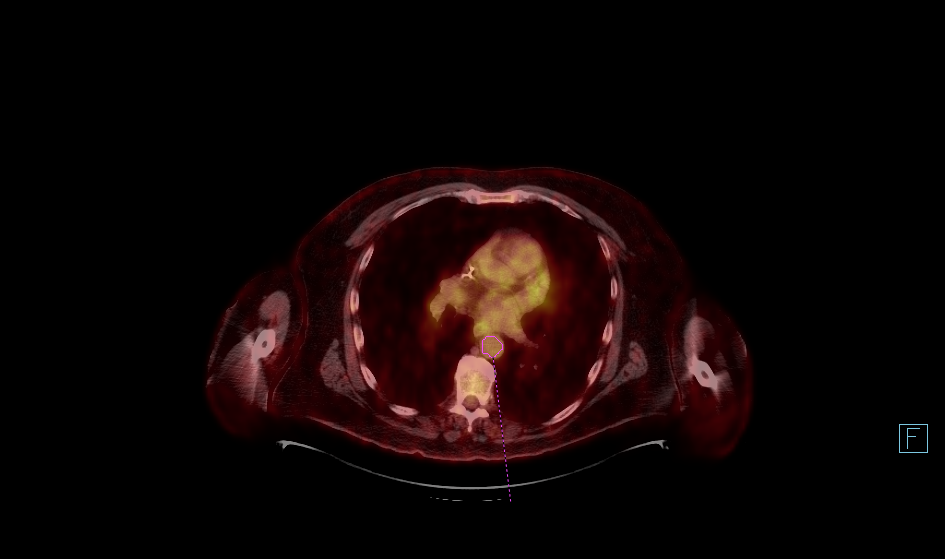

[25 of 25 positions shown; findings below may reference images not displayed]

FINDINGS: NECK: No hypermetabolic lymph nodes in the neck.

Incidental CT findings: none

CHEST: Soft tissue mass distal esophagus is hypermetabolic with SUV
max = 9. No evidence for hypermetabolic mediastinal lymphadenopathy.

Patchy and nodular airspace opacity is seen in the posterior right
lower lobe including a 10 x 11 mm nodular component (image 85/series
4). Low level FDG accumulation is associated with this area of
airspace opacity.

Incidental CT findings: Right Port-A-Cath tip is positioned at the
SVC/RA junction. Coronary artery calcification is evident.
Atherosclerotic calcification is noted in the wall of the thoracic
aorta.

ABDOMEN/PELVIS: No abnormal hypermetabolic activity within the
liver, pancreas, adrenal glands, or spleen. No hypermetabolic lymph
nodes in the abdomen or pelvis.

Incidental CT findings: There is abdominal aortic atherosclerosis
without aneurysm. Left colonic diverticulosis. Moderate to large
left groin hernia contains only fat.

SKELETON: No focal hypermetabolic activity to suggest skeletal
metastasis.

Incidental CT findings: none
IMPRESSION: 1. Hypermetabolic lesion in the distal esophagus compatible with
known neoplasm.
2. Patchy and nodular airspace opacity posterior right lower lobe
with low level FDG uptake. While this is likely related to
infection/inflammation, given the nodularity, attention on follow-up
recommended.
3. No other evidence for hypermetabolic metastatic disease in the
neck, chest, abdomen, or pelvis.

## 2019-09-13 DIAGNOSIS — E8581 Light chain (AL) amyloidosis: Secondary | ICD-10-CM | POA: Diagnosis not present

## 2019-09-27 DIAGNOSIS — E8581 Light chain (AL) amyloidosis: Secondary | ICD-10-CM | POA: Diagnosis not present

## 2019-09-27 DIAGNOSIS — K921 Melena: Secondary | ICD-10-CM | POA: Diagnosis not present

## 2019-09-27 DIAGNOSIS — D62 Acute posthemorrhagic anemia: Secondary | ICD-10-CM | POA: Diagnosis not present

## 2019-09-27 DIAGNOSIS — K922 Gastrointestinal hemorrhage, unspecified: Secondary | ICD-10-CM | POA: Diagnosis not present

## 2019-09-27 DIAGNOSIS — K317 Polyp of stomach and duodenum: Secondary | ICD-10-CM | POA: Diagnosis not present

## 2019-09-27 DIAGNOSIS — I5030 Unspecified diastolic (congestive) heart failure: Secondary | ICD-10-CM | POA: Diagnosis not present

## 2019-09-27 DIAGNOSIS — K228 Other specified diseases of esophagus: Secondary | ICD-10-CM | POA: Diagnosis not present

## 2019-09-27 DIAGNOSIS — I4510 Unspecified right bundle-branch block: Secondary | ICD-10-CM | POA: Diagnosis not present

## 2019-09-27 DIAGNOSIS — J9610 Chronic respiratory failure, unspecified whether with hypoxia or hypercapnia: Secondary | ICD-10-CM | POA: Diagnosis not present

## 2019-09-27 DIAGNOSIS — I130 Hypertensive heart and chronic kidney disease with heart failure and stage 1 through stage 4 chronic kidney disease, or unspecified chronic kidney disease: Secondary | ICD-10-CM | POA: Diagnosis not present

## 2019-09-27 DIAGNOSIS — E859 Amyloidosis, unspecified: Secondary | ICD-10-CM | POA: Diagnosis not present

## 2019-09-27 DIAGNOSIS — J449 Chronic obstructive pulmonary disease, unspecified: Secondary | ICD-10-CM | POA: Diagnosis not present

## 2019-09-27 DIAGNOSIS — D649 Anemia, unspecified: Secondary | ICD-10-CM | POA: Diagnosis not present

## 2019-09-27 DIAGNOSIS — N184 Chronic kidney disease, stage 4 (severe): Secondary | ICD-10-CM | POA: Diagnosis not present

## 2019-09-27 DIAGNOSIS — C9002 Multiple myeloma in relapse: Secondary | ICD-10-CM | POA: Diagnosis not present

## 2019-09-27 DIAGNOSIS — K3189 Other diseases of stomach and duodenum: Secondary | ICD-10-CM | POA: Diagnosis not present

## 2019-09-27 DIAGNOSIS — K449 Diaphragmatic hernia without obstruction or gangrene: Secondary | ICD-10-CM | POA: Diagnosis not present

## 2019-09-27 DIAGNOSIS — Z20822 Contact with and (suspected) exposure to covid-19: Secondary | ICD-10-CM | POA: Diagnosis not present

## 2019-09-27 DIAGNOSIS — I10 Essential (primary) hypertension: Secondary | ICD-10-CM | POA: Diagnosis not present

## 2019-09-27 DIAGNOSIS — I4891 Unspecified atrial fibrillation: Secondary | ICD-10-CM | POA: Diagnosis not present

## 2019-09-27 DIAGNOSIS — R0602 Shortness of breath: Secondary | ICD-10-CM | POA: Diagnosis not present

## 2019-09-27 DIAGNOSIS — I5032 Chronic diastolic (congestive) heart failure: Secondary | ICD-10-CM | POA: Diagnosis not present

## 2019-10-05 DIAGNOSIS — I129 Hypertensive chronic kidney disease with stage 1 through stage 4 chronic kidney disease, or unspecified chronic kidney disease: Secondary | ICD-10-CM | POA: Diagnosis not present

## 2019-10-05 DIAGNOSIS — N184 Chronic kidney disease, stage 4 (severe): Secondary | ICD-10-CM | POA: Diagnosis not present

## 2019-10-05 DIAGNOSIS — E854 Organ-limited amyloidosis: Secondary | ICD-10-CM | POA: Diagnosis not present

## 2019-10-05 DIAGNOSIS — C9000 Multiple myeloma not having achieved remission: Secondary | ICD-10-CM | POA: Diagnosis not present

## 2019-10-05 DIAGNOSIS — C155 Malignant neoplasm of lower third of esophagus: Secondary | ICD-10-CM | POA: Diagnosis not present

## 2019-10-05 DIAGNOSIS — N29 Other disorders of kidney and ureter in diseases classified elsewhere: Secondary | ICD-10-CM | POA: Diagnosis not present

## 2019-10-05 DIAGNOSIS — E8581 Light chain (AL) amyloidosis: Secondary | ICD-10-CM | POA: Diagnosis not present

## 2019-10-05 DIAGNOSIS — Z5112 Encounter for antineoplastic immunotherapy: Secondary | ICD-10-CM | POA: Diagnosis not present

## 2019-10-05 DIAGNOSIS — G629 Polyneuropathy, unspecified: Secondary | ICD-10-CM | POA: Diagnosis not present

## 2019-10-11 DIAGNOSIS — C159 Malignant neoplasm of esophagus, unspecified: Secondary | ICD-10-CM | POA: Diagnosis not present

## 2019-10-11 DIAGNOSIS — D696 Thrombocytopenia, unspecified: Secondary | ICD-10-CM | POA: Diagnosis not present

## 2019-10-11 DIAGNOSIS — D509 Iron deficiency anemia, unspecified: Secondary | ICD-10-CM | POA: Diagnosis not present

## 2019-10-11 DIAGNOSIS — I130 Hypertensive heart and chronic kidney disease with heart failure and stage 1 through stage 4 chronic kidney disease, or unspecified chronic kidney disease: Secondary | ICD-10-CM | POA: Diagnosis not present

## 2019-10-11 DIAGNOSIS — C9000 Multiple myeloma not having achieved remission: Secondary | ICD-10-CM | POA: Diagnosis not present

## 2019-10-11 DIAGNOSIS — N184 Chronic kidney disease, stage 4 (severe): Secondary | ICD-10-CM | POA: Diagnosis not present

## 2019-10-11 DIAGNOSIS — J439 Emphysema, unspecified: Secondary | ICD-10-CM | POA: Diagnosis not present

## 2019-10-11 DIAGNOSIS — Z882 Allergy status to sulfonamides status: Secondary | ICD-10-CM | POA: Diagnosis not present

## 2019-10-11 DIAGNOSIS — I480 Paroxysmal atrial fibrillation: Secondary | ICD-10-CM | POA: Diagnosis not present

## 2019-10-11 DIAGNOSIS — K317 Polyp of stomach and duodenum: Secondary | ICD-10-CM | POA: Diagnosis not present

## 2019-10-14 DIAGNOSIS — N289 Disorder of kidney and ureter, unspecified: Secondary | ICD-10-CM | POA: Diagnosis not present

## 2019-10-14 DIAGNOSIS — K921 Melena: Secondary | ICD-10-CM | POA: Diagnosis not present

## 2019-10-14 DIAGNOSIS — E8581 Light chain (AL) amyloidosis: Secondary | ICD-10-CM | POA: Diagnosis not present

## 2019-10-14 DIAGNOSIS — D500 Iron deficiency anemia secondary to blood loss (chronic): Secondary | ICD-10-CM | POA: Diagnosis not present

## 2019-10-20 DIAGNOSIS — Z66 Do not resuscitate: Secondary | ICD-10-CM | POA: Diagnosis not present

## 2019-10-20 DIAGNOSIS — C155 Malignant neoplasm of lower third of esophagus: Secondary | ICD-10-CM | POA: Diagnosis not present

## 2019-10-20 DIAGNOSIS — C9000 Multiple myeloma not having achieved remission: Secondary | ICD-10-CM | POA: Diagnosis not present

## 2019-10-20 DIAGNOSIS — Z5111 Encounter for antineoplastic chemotherapy: Secondary | ICD-10-CM | POA: Diagnosis not present

## 2019-10-25 DIAGNOSIS — C9000 Multiple myeloma not having achieved remission: Secondary | ICD-10-CM | POA: Diagnosis not present

## 2019-10-28 DIAGNOSIS — J449 Chronic obstructive pulmonary disease, unspecified: Secondary | ICD-10-CM | POA: Diagnosis not present

## 2019-10-28 DIAGNOSIS — R0602 Shortness of breath: Secondary | ICD-10-CM | POA: Diagnosis not present

## 2019-10-29 DIAGNOSIS — R0602 Shortness of breath: Secondary | ICD-10-CM | POA: Diagnosis not present

## 2019-10-29 DIAGNOSIS — J449 Chronic obstructive pulmonary disease, unspecified: Secondary | ICD-10-CM | POA: Diagnosis not present

## 2019-11-02 DIAGNOSIS — Z87891 Personal history of nicotine dependence: Secondary | ICD-10-CM | POA: Diagnosis not present

## 2019-11-02 DIAGNOSIS — I129 Hypertensive chronic kidney disease with stage 1 through stage 4 chronic kidney disease, or unspecified chronic kidney disease: Secondary | ICD-10-CM | POA: Diagnosis not present

## 2019-11-02 DIAGNOSIS — E8581 Light chain (AL) amyloidosis: Secondary | ICD-10-CM | POA: Diagnosis not present

## 2019-11-02 DIAGNOSIS — E785 Hyperlipidemia, unspecified: Secondary | ICD-10-CM | POA: Diagnosis not present

## 2019-11-02 DIAGNOSIS — N184 Chronic kidney disease, stage 4 (severe): Secondary | ICD-10-CM | POA: Diagnosis not present

## 2019-11-02 DIAGNOSIS — C9000 Multiple myeloma not having achieved remission: Secondary | ICD-10-CM | POA: Diagnosis not present

## 2019-11-02 DIAGNOSIS — D696 Thrombocytopenia, unspecified: Secondary | ICD-10-CM | POA: Diagnosis not present

## 2019-11-02 DIAGNOSIS — J449 Chronic obstructive pulmonary disease, unspecified: Secondary | ICD-10-CM | POA: Diagnosis not present

## 2019-11-02 DIAGNOSIS — C155 Malignant neoplasm of lower third of esophagus: Secondary | ICD-10-CM | POA: Diagnosis not present

## 2019-11-08 DIAGNOSIS — Z5112 Encounter for antineoplastic immunotherapy: Secondary | ICD-10-CM | POA: Diagnosis not present

## 2019-11-08 DIAGNOSIS — K921 Melena: Secondary | ICD-10-CM | POA: Diagnosis not present

## 2019-11-08 DIAGNOSIS — N289 Disorder of kidney and ureter, unspecified: Secondary | ICD-10-CM | POA: Diagnosis not present

## 2019-11-08 DIAGNOSIS — C9000 Multiple myeloma not having achieved remission: Secondary | ICD-10-CM | POA: Diagnosis not present

## 2019-11-08 DIAGNOSIS — C155 Malignant neoplasm of lower third of esophagus: Secondary | ICD-10-CM | POA: Diagnosis not present

## 2019-11-08 DIAGNOSIS — D500 Iron deficiency anemia secondary to blood loss (chronic): Secondary | ICD-10-CM | POA: Diagnosis not present

## 2019-11-08 DIAGNOSIS — Z66 Do not resuscitate: Secondary | ICD-10-CM | POA: Diagnosis not present

## 2019-11-08 DIAGNOSIS — E8581 Light chain (AL) amyloidosis: Secondary | ICD-10-CM | POA: Diagnosis not present

## 2019-11-16 DIAGNOSIS — C9000 Multiple myeloma not having achieved remission: Secondary | ICD-10-CM | POA: Diagnosis not present

## 2019-11-16 DIAGNOSIS — D500 Iron deficiency anemia secondary to blood loss (chronic): Secondary | ICD-10-CM | POA: Diagnosis not present

## 2019-11-16 DIAGNOSIS — Z5112 Encounter for antineoplastic immunotherapy: Secondary | ICD-10-CM | POA: Diagnosis not present

## 2019-11-16 DIAGNOSIS — K921 Melena: Secondary | ICD-10-CM | POA: Diagnosis not present

## 2019-11-16 DIAGNOSIS — N289 Disorder of kidney and ureter, unspecified: Secondary | ICD-10-CM | POA: Diagnosis not present

## 2019-11-16 DIAGNOSIS — E8581 Light chain (AL) amyloidosis: Secondary | ICD-10-CM | POA: Diagnosis not present

## 2019-11-17 DIAGNOSIS — I129 Hypertensive chronic kidney disease with stage 1 through stage 4 chronic kidney disease, or unspecified chronic kidney disease: Secondary | ICD-10-CM | POA: Diagnosis not present

## 2019-11-17 DIAGNOSIS — N289 Disorder of kidney and ureter, unspecified: Secondary | ICD-10-CM | POA: Diagnosis not present

## 2019-11-17 DIAGNOSIS — D649 Anemia, unspecified: Secondary | ICD-10-CM | POA: Diagnosis not present

## 2019-11-17 DIAGNOSIS — N183 Chronic kidney disease, stage 3 unspecified: Secondary | ICD-10-CM | POA: Diagnosis not present

## 2019-11-17 DIAGNOSIS — I10 Essential (primary) hypertension: Secondary | ICD-10-CM | POA: Diagnosis not present

## 2019-11-19 DIAGNOSIS — J449 Chronic obstructive pulmonary disease, unspecified: Secondary | ICD-10-CM | POA: Diagnosis not present

## 2019-11-19 DIAGNOSIS — N183 Chronic kidney disease, stage 3 unspecified: Secondary | ICD-10-CM | POA: Diagnosis not present

## 2019-11-19 DIAGNOSIS — Z1339 Encounter for screening examination for other mental health and behavioral disorders: Secondary | ICD-10-CM | POA: Diagnosis not present

## 2019-11-19 DIAGNOSIS — Z1331 Encounter for screening for depression: Secondary | ICD-10-CM | POA: Diagnosis not present

## 2019-11-19 DIAGNOSIS — Z6832 Body mass index (BMI) 32.0-32.9, adult: Secondary | ICD-10-CM | POA: Diagnosis not present

## 2019-11-19 DIAGNOSIS — Z299 Encounter for prophylactic measures, unspecified: Secondary | ICD-10-CM | POA: Diagnosis not present

## 2019-11-19 DIAGNOSIS — Z0000 Encounter for general adult medical examination without abnormal findings: Secondary | ICD-10-CM | POA: Diagnosis not present

## 2019-11-19 DIAGNOSIS — Z1211 Encounter for screening for malignant neoplasm of colon: Secondary | ICD-10-CM | POA: Diagnosis not present

## 2019-11-19 DIAGNOSIS — Z7189 Other specified counseling: Secondary | ICD-10-CM | POA: Diagnosis not present

## 2019-11-23 DIAGNOSIS — Z5112 Encounter for antineoplastic immunotherapy: Secondary | ICD-10-CM | POA: Diagnosis not present

## 2019-11-23 DIAGNOSIS — N289 Disorder of kidney and ureter, unspecified: Secondary | ICD-10-CM | POA: Diagnosis not present

## 2019-11-23 DIAGNOSIS — C9000 Multiple myeloma not having achieved remission: Secondary | ICD-10-CM | POA: Diagnosis not present

## 2019-11-23 DIAGNOSIS — D500 Iron deficiency anemia secondary to blood loss (chronic): Secondary | ICD-10-CM | POA: Diagnosis not present

## 2019-11-23 DIAGNOSIS — E8581 Light chain (AL) amyloidosis: Secondary | ICD-10-CM | POA: Diagnosis not present

## 2019-11-23 DIAGNOSIS — K921 Melena: Secondary | ICD-10-CM | POA: Diagnosis not present

## 2019-11-27 DIAGNOSIS — R0602 Shortness of breath: Secondary | ICD-10-CM | POA: Diagnosis not present

## 2019-11-27 DIAGNOSIS — J449 Chronic obstructive pulmonary disease, unspecified: Secondary | ICD-10-CM | POA: Diagnosis not present

## 2019-11-28 DIAGNOSIS — R0602 Shortness of breath: Secondary | ICD-10-CM | POA: Diagnosis not present

## 2019-11-28 DIAGNOSIS — J449 Chronic obstructive pulmonary disease, unspecified: Secondary | ICD-10-CM | POA: Diagnosis not present

## 2019-11-30 DIAGNOSIS — E859 Amyloidosis, unspecified: Secondary | ICD-10-CM | POA: Diagnosis not present

## 2019-11-30 DIAGNOSIS — I129 Hypertensive chronic kidney disease with stage 1 through stage 4 chronic kidney disease, or unspecified chronic kidney disease: Secondary | ICD-10-CM | POA: Diagnosis not present

## 2019-11-30 DIAGNOSIS — C155 Malignant neoplasm of lower third of esophagus: Secondary | ICD-10-CM | POA: Diagnosis not present

## 2019-11-30 DIAGNOSIS — G629 Polyneuropathy, unspecified: Secondary | ICD-10-CM | POA: Diagnosis not present

## 2019-11-30 DIAGNOSIS — E8581 Light chain (AL) amyloidosis: Secondary | ICD-10-CM | POA: Diagnosis not present

## 2019-11-30 DIAGNOSIS — J449 Chronic obstructive pulmonary disease, unspecified: Secondary | ICD-10-CM | POA: Diagnosis not present

## 2019-11-30 DIAGNOSIS — Z5112 Encounter for antineoplastic immunotherapy: Secondary | ICD-10-CM | POA: Diagnosis not present

## 2019-11-30 DIAGNOSIS — N184 Chronic kidney disease, stage 4 (severe): Secondary | ICD-10-CM | POA: Diagnosis not present

## 2019-11-30 DIAGNOSIS — D696 Thrombocytopenia, unspecified: Secondary | ICD-10-CM | POA: Diagnosis not present

## 2019-11-30 DIAGNOSIS — C9000 Multiple myeloma not having achieved remission: Secondary | ICD-10-CM | POA: Diagnosis not present

## 2019-12-06 DIAGNOSIS — Z5112 Encounter for antineoplastic immunotherapy: Secondary | ICD-10-CM | POA: Diagnosis not present

## 2019-12-06 DIAGNOSIS — Z66 Do not resuscitate: Secondary | ICD-10-CM | POA: Diagnosis not present

## 2019-12-06 DIAGNOSIS — C9000 Multiple myeloma not having achieved remission: Secondary | ICD-10-CM | POA: Diagnosis not present

## 2019-12-20 DIAGNOSIS — K838 Other specified diseases of biliary tract: Secondary | ICD-10-CM | POA: Diagnosis not present

## 2019-12-20 DIAGNOSIS — K8020 Calculus of gallbladder without cholecystitis without obstruction: Secondary | ICD-10-CM | POA: Diagnosis not present

## 2019-12-20 DIAGNOSIS — D696 Thrombocytopenia, unspecified: Secondary | ICD-10-CM | POA: Diagnosis not present

## 2019-12-20 DIAGNOSIS — C9000 Multiple myeloma not having achieved remission: Secondary | ICD-10-CM | POA: Diagnosis not present

## 2019-12-20 DIAGNOSIS — R160 Hepatomegaly, not elsewhere classified: Secondary | ICD-10-CM | POA: Diagnosis not present

## 2019-12-20 DIAGNOSIS — Z5112 Encounter for antineoplastic immunotherapy: Secondary | ICD-10-CM | POA: Diagnosis not present

## 2019-12-20 DIAGNOSIS — C155 Malignant neoplasm of lower third of esophagus: Secondary | ICD-10-CM | POA: Diagnosis not present

## 2019-12-28 DIAGNOSIS — R0602 Shortness of breath: Secondary | ICD-10-CM | POA: Diagnosis not present

## 2019-12-28 DIAGNOSIS — J449 Chronic obstructive pulmonary disease, unspecified: Secondary | ICD-10-CM | POA: Diagnosis not present

## 2019-12-29 DIAGNOSIS — J449 Chronic obstructive pulmonary disease, unspecified: Secondary | ICD-10-CM | POA: Diagnosis not present

## 2019-12-29 DIAGNOSIS — R0602 Shortness of breath: Secondary | ICD-10-CM | POA: Diagnosis not present

## 2020-01-04 DIAGNOSIS — C155 Malignant neoplasm of lower third of esophagus: Secondary | ICD-10-CM | POA: Diagnosis not present

## 2020-01-04 DIAGNOSIS — Z5112 Encounter for antineoplastic immunotherapy: Secondary | ICD-10-CM | POA: Diagnosis not present

## 2020-01-04 DIAGNOSIS — C9000 Multiple myeloma not having achieved remission: Secondary | ICD-10-CM | POA: Diagnosis not present

## 2020-01-17 DIAGNOSIS — C9000 Multiple myeloma not having achieved remission: Secondary | ICD-10-CM | POA: Diagnosis not present

## 2020-01-17 DIAGNOSIS — Z5111 Encounter for antineoplastic chemotherapy: Secondary | ICD-10-CM | POA: Diagnosis not present

## 2020-01-20 DIAGNOSIS — E8581 Light chain (AL) amyloidosis: Secondary | ICD-10-CM | POA: Diagnosis not present

## 2020-01-20 DIAGNOSIS — K921 Melena: Secondary | ICD-10-CM | POA: Diagnosis not present

## 2020-01-20 DIAGNOSIS — N289 Disorder of kidney and ureter, unspecified: Secondary | ICD-10-CM | POA: Diagnosis not present

## 2020-01-20 DIAGNOSIS — Z66 Do not resuscitate: Secondary | ICD-10-CM | POA: Diagnosis not present

## 2020-01-20 DIAGNOSIS — D500 Iron deficiency anemia secondary to blood loss (chronic): Secondary | ICD-10-CM | POA: Diagnosis not present

## 2020-01-28 DIAGNOSIS — R0602 Shortness of breath: Secondary | ICD-10-CM | POA: Diagnosis not present

## 2020-01-28 DIAGNOSIS — J449 Chronic obstructive pulmonary disease, unspecified: Secondary | ICD-10-CM | POA: Diagnosis not present

## 2020-01-29 DIAGNOSIS — R0602 Shortness of breath: Secondary | ICD-10-CM | POA: Diagnosis not present

## 2020-01-29 DIAGNOSIS — J449 Chronic obstructive pulmonary disease, unspecified: Secondary | ICD-10-CM | POA: Diagnosis not present

## 2020-02-01 DIAGNOSIS — I129 Hypertensive chronic kidney disease with stage 1 through stage 4 chronic kidney disease, or unspecified chronic kidney disease: Secondary | ICD-10-CM | POA: Diagnosis not present

## 2020-02-01 DIAGNOSIS — D649 Anemia, unspecified: Secondary | ICD-10-CM | POA: Diagnosis not present

## 2020-02-01 DIAGNOSIS — N184 Chronic kidney disease, stage 4 (severe): Secondary | ICD-10-CM | POA: Diagnosis not present

## 2020-02-01 DIAGNOSIS — Z5111 Encounter for antineoplastic chemotherapy: Secondary | ICD-10-CM | POA: Diagnosis not present

## 2020-02-01 DIAGNOSIS — C9000 Multiple myeloma not having achieved remission: Secondary | ICD-10-CM | POA: Diagnosis not present

## 2020-02-01 DIAGNOSIS — J449 Chronic obstructive pulmonary disease, unspecified: Secondary | ICD-10-CM | POA: Diagnosis not present

## 2020-02-01 DIAGNOSIS — E8581 Light chain (AL) amyloidosis: Secondary | ICD-10-CM | POA: Diagnosis not present

## 2020-02-01 DIAGNOSIS — K760 Fatty (change of) liver, not elsewhere classified: Secondary | ICD-10-CM | POA: Diagnosis not present

## 2020-02-01 DIAGNOSIS — D696 Thrombocytopenia, unspecified: Secondary | ICD-10-CM | POA: Diagnosis not present

## 2020-02-04 DIAGNOSIS — E8581 Light chain (AL) amyloidosis: Secondary | ICD-10-CM | POA: Diagnosis not present

## 2020-02-04 DIAGNOSIS — K921 Melena: Secondary | ICD-10-CM | POA: Diagnosis not present

## 2020-02-04 DIAGNOSIS — N289 Disorder of kidney and ureter, unspecified: Secondary | ICD-10-CM | POA: Diagnosis not present

## 2020-02-04 DIAGNOSIS — D500 Iron deficiency anemia secondary to blood loss (chronic): Secondary | ICD-10-CM | POA: Diagnosis not present

## 2020-02-14 DIAGNOSIS — J449 Chronic obstructive pulmonary disease, unspecified: Secondary | ICD-10-CM | POA: Diagnosis not present

## 2020-02-14 DIAGNOSIS — N184 Chronic kidney disease, stage 4 (severe): Secondary | ICD-10-CM | POA: Diagnosis not present

## 2020-02-14 DIAGNOSIS — Z5111 Encounter for antineoplastic chemotherapy: Secondary | ICD-10-CM | POA: Diagnosis not present

## 2020-02-14 DIAGNOSIS — C9000 Multiple myeloma not having achieved remission: Secondary | ICD-10-CM | POA: Diagnosis not present

## 2020-02-14 DIAGNOSIS — I129 Hypertensive chronic kidney disease with stage 1 through stage 4 chronic kidney disease, or unspecified chronic kidney disease: Secondary | ICD-10-CM | POA: Diagnosis not present

## 2020-02-14 DIAGNOSIS — R079 Chest pain, unspecified: Secondary | ICD-10-CM | POA: Diagnosis not present

## 2020-02-14 DIAGNOSIS — R05 Cough: Secondary | ICD-10-CM | POA: Diagnosis not present

## 2020-02-14 DIAGNOSIS — R0600 Dyspnea, unspecified: Secondary | ICD-10-CM | POA: Diagnosis not present

## 2020-02-14 DIAGNOSIS — M79604 Pain in right leg: Secondary | ICD-10-CM | POA: Diagnosis not present

## 2020-02-14 DIAGNOSIS — L03115 Cellulitis of right lower limb: Secondary | ICD-10-CM | POA: Diagnosis not present

## 2020-02-14 DIAGNOSIS — E785 Hyperlipidemia, unspecified: Secondary | ICD-10-CM | POA: Diagnosis not present

## 2020-02-14 DIAGNOSIS — E854 Organ-limited amyloidosis: Secondary | ICD-10-CM | POA: Diagnosis not present

## 2020-02-14 DIAGNOSIS — I4891 Unspecified atrial fibrillation: Secondary | ICD-10-CM | POA: Diagnosis not present

## 2020-02-14 DIAGNOSIS — M7989 Other specified soft tissue disorders: Secondary | ICD-10-CM | POA: Diagnosis not present

## 2020-02-14 DIAGNOSIS — Z66 Do not resuscitate: Secondary | ICD-10-CM | POA: Diagnosis not present

## 2020-02-14 DIAGNOSIS — R918 Other nonspecific abnormal finding of lung field: Secondary | ICD-10-CM | POA: Diagnosis not present

## 2020-02-14 DIAGNOSIS — Z20822 Contact with and (suspected) exposure to covid-19: Secondary | ICD-10-CM | POA: Diagnosis not present

## 2020-02-14 DIAGNOSIS — R0602 Shortness of breath: Secondary | ICD-10-CM | POA: Diagnosis not present

## 2020-02-14 DIAGNOSIS — J90 Pleural effusion, not elsewhere classified: Secondary | ICD-10-CM | POA: Diagnosis not present

## 2020-02-17 DIAGNOSIS — Z66 Do not resuscitate: Secondary | ICD-10-CM | POA: Diagnosis not present

## 2020-02-17 DIAGNOSIS — C9000 Multiple myeloma not having achieved remission: Secondary | ICD-10-CM | POA: Diagnosis not present

## 2020-02-17 DIAGNOSIS — E8809 Other disorders of plasma-protein metabolism, not elsewhere classified: Secondary | ICD-10-CM | POA: Diagnosis not present

## 2020-02-17 DIAGNOSIS — C155 Malignant neoplasm of lower third of esophagus: Secondary | ICD-10-CM | POA: Diagnosis not present

## 2020-02-18 DIAGNOSIS — H35372 Puckering of macula, left eye: Secondary | ICD-10-CM | POA: Diagnosis not present

## 2020-02-18 DIAGNOSIS — H401131 Primary open-angle glaucoma, bilateral, mild stage: Secondary | ICD-10-CM | POA: Diagnosis not present

## 2020-02-18 DIAGNOSIS — Z961 Presence of intraocular lens: Secondary | ICD-10-CM | POA: Diagnosis not present

## 2020-02-21 DIAGNOSIS — K449 Diaphragmatic hernia without obstruction or gangrene: Secondary | ICD-10-CM | POA: Diagnosis not present

## 2020-02-21 DIAGNOSIS — C159 Malignant neoplasm of esophagus, unspecified: Secondary | ICD-10-CM | POA: Diagnosis not present

## 2020-02-21 DIAGNOSIS — I130 Hypertensive heart and chronic kidney disease with heart failure and stage 1 through stage 4 chronic kidney disease, or unspecified chronic kidney disease: Secondary | ICD-10-CM | POA: Diagnosis not present

## 2020-02-21 DIAGNOSIS — I5030 Unspecified diastolic (congestive) heart failure: Secondary | ICD-10-CM | POA: Diagnosis not present

## 2020-02-21 DIAGNOSIS — K2282 Esophagogastric junction polyp: Secondary | ICD-10-CM | POA: Diagnosis not present

## 2020-02-21 DIAGNOSIS — N184 Chronic kidney disease, stage 4 (severe): Secondary | ICD-10-CM | POA: Diagnosis not present

## 2020-02-21 DIAGNOSIS — J439 Emphysema, unspecified: Secondary | ICD-10-CM | POA: Diagnosis not present

## 2020-02-21 DIAGNOSIS — Z882 Allergy status to sulfonamides status: Secondary | ICD-10-CM | POA: Diagnosis not present

## 2020-02-21 DIAGNOSIS — Z79899 Other long term (current) drug therapy: Secondary | ICD-10-CM | POA: Diagnosis not present

## 2020-02-27 DIAGNOSIS — J449 Chronic obstructive pulmonary disease, unspecified: Secondary | ICD-10-CM | POA: Diagnosis not present

## 2020-02-27 DIAGNOSIS — R0602 Shortness of breath: Secondary | ICD-10-CM | POA: Diagnosis not present

## 2020-02-28 DIAGNOSIS — J449 Chronic obstructive pulmonary disease, unspecified: Secondary | ICD-10-CM | POA: Diagnosis not present

## 2020-02-28 DIAGNOSIS — R0602 Shortness of breath: Secondary | ICD-10-CM | POA: Diagnosis not present

## 2020-02-29 DIAGNOSIS — R918 Other nonspecific abnormal finding of lung field: Secondary | ICD-10-CM | POA: Diagnosis not present

## 2020-02-29 DIAGNOSIS — J90 Pleural effusion, not elsewhere classified: Secondary | ICD-10-CM | POA: Diagnosis not present

## 2020-02-29 DIAGNOSIS — C9000 Multiple myeloma not having achieved remission: Secondary | ICD-10-CM | POA: Diagnosis not present

## 2020-02-29 DIAGNOSIS — G629 Polyneuropathy, unspecified: Secondary | ICD-10-CM | POA: Diagnosis not present

## 2020-02-29 DIAGNOSIS — N184 Chronic kidney disease, stage 4 (severe): Secondary | ICD-10-CM | POA: Diagnosis not present

## 2020-02-29 DIAGNOSIS — E8581 Light chain (AL) amyloidosis: Secondary | ICD-10-CM | POA: Diagnosis not present

## 2020-02-29 DIAGNOSIS — N189 Chronic kidney disease, unspecified: Secondary | ICD-10-CM | POA: Diagnosis not present

## 2020-02-29 DIAGNOSIS — I129 Hypertensive chronic kidney disease with stage 1 through stage 4 chronic kidney disease, or unspecified chronic kidney disease: Secondary | ICD-10-CM | POA: Diagnosis not present

## 2020-02-29 DIAGNOSIS — K22711 Barrett's esophagus with high grade dysplasia: Secondary | ICD-10-CM | POA: Diagnosis not present

## 2020-02-29 DIAGNOSIS — D696 Thrombocytopenia, unspecified: Secondary | ICD-10-CM | POA: Diagnosis not present

## 2020-02-29 DIAGNOSIS — J449 Chronic obstructive pulmonary disease, unspecified: Secondary | ICD-10-CM | POA: Diagnosis not present

## 2020-02-29 DIAGNOSIS — D631 Anemia in chronic kidney disease: Secondary | ICD-10-CM | POA: Diagnosis not present

## 2020-02-29 DIAGNOSIS — K760 Fatty (change of) liver, not elsewhere classified: Secondary | ICD-10-CM | POA: Diagnosis not present

## 2020-02-29 DIAGNOSIS — N179 Acute kidney failure, unspecified: Secondary | ICD-10-CM | POA: Diagnosis not present

## 2020-02-29 DIAGNOSIS — Z87891 Personal history of nicotine dependence: Secondary | ICD-10-CM | POA: Diagnosis not present

## 2020-03-13 DIAGNOSIS — Z66 Do not resuscitate: Secondary | ICD-10-CM | POA: Diagnosis not present

## 2020-03-13 DIAGNOSIS — E8581 Light chain (AL) amyloidosis: Secondary | ICD-10-CM | POA: Diagnosis not present

## 2020-03-13 DIAGNOSIS — Z5111 Encounter for antineoplastic chemotherapy: Secondary | ICD-10-CM | POA: Diagnosis not present

## 2020-03-21 DIAGNOSIS — Z5111 Encounter for antineoplastic chemotherapy: Secondary | ICD-10-CM | POA: Diagnosis not present

## 2020-03-21 DIAGNOSIS — C155 Malignant neoplasm of lower third of esophagus: Secondary | ICD-10-CM | POA: Diagnosis not present

## 2020-03-21 DIAGNOSIS — I4891 Unspecified atrial fibrillation: Secondary | ICD-10-CM | POA: Diagnosis not present

## 2020-03-21 DIAGNOSIS — E8581 Light chain (AL) amyloidosis: Secondary | ICD-10-CM | POA: Diagnosis not present

## 2020-03-22 DIAGNOSIS — G4733 Obstructive sleep apnea (adult) (pediatric): Secondary | ICD-10-CM | POA: Diagnosis not present

## 2020-03-22 DIAGNOSIS — I4891 Unspecified atrial fibrillation: Secondary | ICD-10-CM | POA: Diagnosis not present

## 2020-03-22 DIAGNOSIS — K22711 Barrett's esophagus with high grade dysplasia: Secondary | ICD-10-CM | POA: Diagnosis not present

## 2020-03-22 DIAGNOSIS — C9000 Multiple myeloma not having achieved remission: Secondary | ICD-10-CM | POA: Diagnosis not present

## 2020-03-22 DIAGNOSIS — E8581 Light chain (AL) amyloidosis: Secondary | ICD-10-CM | POA: Diagnosis not present

## 2020-03-22 DIAGNOSIS — J449 Chronic obstructive pulmonary disease, unspecified: Secondary | ICD-10-CM | POA: Diagnosis not present

## 2020-03-22 DIAGNOSIS — N08 Glomerular disorders in diseases classified elsewhere: Secondary | ICD-10-CM | POA: Diagnosis not present

## 2020-03-22 DIAGNOSIS — E854 Organ-limited amyloidosis: Secondary | ICD-10-CM | POA: Diagnosis not present

## 2020-03-22 DIAGNOSIS — E785 Hyperlipidemia, unspecified: Secondary | ICD-10-CM | POA: Diagnosis not present

## 2020-03-22 DIAGNOSIS — E859 Amyloidosis, unspecified: Secondary | ICD-10-CM | POA: Diagnosis not present

## 2020-03-27 DIAGNOSIS — Z5112 Encounter for antineoplastic immunotherapy: Secondary | ICD-10-CM | POA: Diagnosis not present

## 2020-03-27 DIAGNOSIS — C155 Malignant neoplasm of lower third of esophagus: Secondary | ICD-10-CM | POA: Diagnosis not present

## 2020-03-27 DIAGNOSIS — Z5111 Encounter for antineoplastic chemotherapy: Secondary | ICD-10-CM | POA: Diagnosis not present

## 2020-03-27 DIAGNOSIS — E8581 Light chain (AL) amyloidosis: Secondary | ICD-10-CM | POA: Diagnosis not present

## 2020-03-27 DIAGNOSIS — I4891 Unspecified atrial fibrillation: Secondary | ICD-10-CM | POA: Diagnosis not present

## 2020-03-29 DIAGNOSIS — J449 Chronic obstructive pulmonary disease, unspecified: Secondary | ICD-10-CM | POA: Diagnosis not present

## 2020-03-29 DIAGNOSIS — R0602 Shortness of breath: Secondary | ICD-10-CM | POA: Diagnosis not present

## 2020-03-30 DIAGNOSIS — J449 Chronic obstructive pulmonary disease, unspecified: Secondary | ICD-10-CM | POA: Diagnosis not present

## 2020-03-30 DIAGNOSIS — R0602 Shortness of breath: Secondary | ICD-10-CM | POA: Diagnosis not present

## 2020-04-04 DIAGNOSIS — E8581 Light chain (AL) amyloidosis: Secondary | ICD-10-CM | POA: Diagnosis not present

## 2020-04-04 DIAGNOSIS — C155 Malignant neoplasm of lower third of esophagus: Secondary | ICD-10-CM | POA: Diagnosis not present

## 2020-04-04 DIAGNOSIS — I129 Hypertensive chronic kidney disease with stage 1 through stage 4 chronic kidney disease, or unspecified chronic kidney disease: Secondary | ICD-10-CM | POA: Diagnosis not present

## 2020-04-04 DIAGNOSIS — Z23 Encounter for immunization: Secondary | ICD-10-CM | POA: Diagnosis not present

## 2020-04-04 DIAGNOSIS — D631 Anemia in chronic kidney disease: Secondary | ICD-10-CM | POA: Diagnosis not present

## 2020-04-04 DIAGNOSIS — N184 Chronic kidney disease, stage 4 (severe): Secondary | ICD-10-CM | POA: Diagnosis not present

## 2020-04-04 DIAGNOSIS — D696 Thrombocytopenia, unspecified: Secondary | ICD-10-CM | POA: Diagnosis not present

## 2020-04-04 DIAGNOSIS — N29 Other disorders of kidney and ureter in diseases classified elsewhere: Secondary | ICD-10-CM | POA: Diagnosis not present

## 2020-04-04 DIAGNOSIS — E854 Organ-limited amyloidosis: Secondary | ICD-10-CM | POA: Diagnosis not present

## 2020-04-06 DIAGNOSIS — K921 Melena: Secondary | ICD-10-CM | POA: Diagnosis not present

## 2020-04-06 DIAGNOSIS — N289 Disorder of kidney and ureter, unspecified: Secondary | ICD-10-CM | POA: Diagnosis not present

## 2020-04-06 DIAGNOSIS — D500 Iron deficiency anemia secondary to blood loss (chronic): Secondary | ICD-10-CM | POA: Diagnosis not present

## 2020-04-06 DIAGNOSIS — C155 Malignant neoplasm of lower third of esophagus: Secondary | ICD-10-CM | POA: Diagnosis not present

## 2020-04-06 DIAGNOSIS — E8581 Light chain (AL) amyloidosis: Secondary | ICD-10-CM | POA: Diagnosis not present

## 2020-04-10 DIAGNOSIS — E8581 Light chain (AL) amyloidosis: Secondary | ICD-10-CM | POA: Diagnosis not present

## 2020-04-10 DIAGNOSIS — C155 Malignant neoplasm of lower third of esophagus: Secondary | ICD-10-CM | POA: Diagnosis not present

## 2020-04-10 DIAGNOSIS — Z5111 Encounter for antineoplastic chemotherapy: Secondary | ICD-10-CM | POA: Diagnosis not present

## 2020-04-10 DIAGNOSIS — C9000 Multiple myeloma not having achieved remission: Secondary | ICD-10-CM | POA: Diagnosis not present

## 2020-04-17 DIAGNOSIS — R600 Localized edema: Secondary | ICD-10-CM | POA: Diagnosis not present

## 2020-04-17 DIAGNOSIS — R059 Cough, unspecified: Secondary | ICD-10-CM | POA: Diagnosis not present

## 2020-04-17 DIAGNOSIS — D696 Thrombocytopenia, unspecified: Secondary | ICD-10-CM | POA: Diagnosis not present

## 2020-04-17 DIAGNOSIS — C159 Malignant neoplasm of esophagus, unspecified: Secondary | ICD-10-CM | POA: Diagnosis not present

## 2020-04-17 DIAGNOSIS — Z20822 Contact with and (suspected) exposure to covid-19: Secondary | ICD-10-CM | POA: Diagnosis not present

## 2020-04-17 DIAGNOSIS — D649 Anemia, unspecified: Secondary | ICD-10-CM | POA: Diagnosis not present

## 2020-04-17 DIAGNOSIS — I10 Essential (primary) hypertension: Secondary | ICD-10-CM | POA: Diagnosis not present

## 2020-04-17 DIAGNOSIS — R1030 Lower abdominal pain, unspecified: Secondary | ICD-10-CM | POA: Diagnosis not present

## 2020-04-17 DIAGNOSIS — J449 Chronic obstructive pulmonary disease, unspecified: Secondary | ICD-10-CM | POA: Diagnosis not present

## 2020-04-17 DIAGNOSIS — J9621 Acute and chronic respiratory failure with hypoxia: Secondary | ICD-10-CM | POA: Diagnosis not present

## 2020-04-17 DIAGNOSIS — R339 Retention of urine, unspecified: Secondary | ICD-10-CM | POA: Diagnosis not present

## 2020-04-17 DIAGNOSIS — E8581 Light chain (AL) amyloidosis: Secondary | ICD-10-CM | POA: Diagnosis not present

## 2020-04-17 DIAGNOSIS — R0781 Pleurodynia: Secondary | ICD-10-CM | POA: Diagnosis not present

## 2020-04-17 DIAGNOSIS — R279 Unspecified lack of coordination: Secondary | ICD-10-CM | POA: Diagnosis not present

## 2020-04-17 DIAGNOSIS — Z66 Do not resuscitate: Secondary | ICD-10-CM | POA: Diagnosis not present

## 2020-04-17 DIAGNOSIS — K2270 Barrett's esophagus without dysplasia: Secondary | ICD-10-CM | POA: Diagnosis not present

## 2020-04-17 DIAGNOSIS — K922 Gastrointestinal hemorrhage, unspecified: Secondary | ICD-10-CM | POA: Diagnosis not present

## 2020-04-17 DIAGNOSIS — J189 Pneumonia, unspecified organism: Secondary | ICD-10-CM | POA: Diagnosis not present

## 2020-04-17 DIAGNOSIS — C155 Malignant neoplasm of lower third of esophagus: Secondary | ICD-10-CM | POA: Diagnosis not present

## 2020-04-17 DIAGNOSIS — Z9981 Dependence on supplemental oxygen: Secondary | ICD-10-CM | POA: Diagnosis not present

## 2020-04-17 DIAGNOSIS — J15212 Pneumonia due to Methicillin resistant Staphylococcus aureus: Secondary | ICD-10-CM | POA: Diagnosis not present

## 2020-04-17 DIAGNOSIS — N170 Acute kidney failure with tubular necrosis: Secondary | ICD-10-CM | POA: Diagnosis not present

## 2020-04-17 DIAGNOSIS — N179 Acute kidney failure, unspecified: Secondary | ICD-10-CM | POA: Diagnosis not present

## 2020-04-17 DIAGNOSIS — R319 Hematuria, unspecified: Secondary | ICD-10-CM | POA: Diagnosis not present

## 2020-04-17 DIAGNOSIS — K4090 Unilateral inguinal hernia, without obstruction or gangrene, not specified as recurrent: Secondary | ICD-10-CM | POA: Diagnosis not present

## 2020-04-17 DIAGNOSIS — J1520 Pneumonia due to staphylococcus, unspecified: Secondary | ICD-10-CM | POA: Diagnosis not present

## 2020-04-17 DIAGNOSIS — A499 Bacterial infection, unspecified: Secondary | ICD-10-CM | POA: Diagnosis not present

## 2020-04-17 DIAGNOSIS — N189 Chronic kidney disease, unspecified: Secondary | ICD-10-CM | POA: Diagnosis not present

## 2020-04-17 DIAGNOSIS — C9000 Multiple myeloma not having achieved remission: Secondary | ICD-10-CM | POA: Diagnosis not present

## 2020-04-17 DIAGNOSIS — E440 Moderate protein-calorie malnutrition: Secondary | ICD-10-CM | POA: Diagnosis not present

## 2020-04-17 DIAGNOSIS — J9620 Acute and chronic respiratory failure, unspecified whether with hypoxia or hypercapnia: Secondary | ICD-10-CM | POA: Diagnosis not present

## 2020-04-17 DIAGNOSIS — J440 Chronic obstructive pulmonary disease with acute lower respiratory infection: Secondary | ICD-10-CM | POA: Diagnosis not present

## 2020-04-17 DIAGNOSIS — Z7409 Other reduced mobility: Secondary | ICD-10-CM | POA: Diagnosis not present

## 2020-04-17 DIAGNOSIS — R918 Other nonspecific abnormal finding of lung field: Secondary | ICD-10-CM | POA: Diagnosis not present

## 2020-04-17 DIAGNOSIS — R0602 Shortness of breath: Secondary | ICD-10-CM | POA: Diagnosis not present

## 2020-04-17 DIAGNOSIS — J439 Emphysema, unspecified: Secondary | ICD-10-CM | POA: Diagnosis not present

## 2020-04-17 DIAGNOSIS — J90 Pleural effusion, not elsewhere classified: Secondary | ICD-10-CM | POA: Diagnosis not present

## 2020-04-17 DIAGNOSIS — R5381 Other malaise: Secondary | ICD-10-CM | POA: Diagnosis not present

## 2020-04-25 DIAGNOSIS — B49 Unspecified mycosis: Secondary | ICD-10-CM | POA: Diagnosis not present

## 2020-04-25 DIAGNOSIS — J9811 Atelectasis: Secondary | ICD-10-CM | POA: Diagnosis not present

## 2020-04-25 DIAGNOSIS — E440 Moderate protein-calorie malnutrition: Secondary | ICD-10-CM | POA: Diagnosis not present

## 2020-04-25 DIAGNOSIS — R059 Cough, unspecified: Secondary | ICD-10-CM | POA: Diagnosis not present

## 2020-04-25 DIAGNOSIS — J90 Pleural effusion, not elsewhere classified: Secondary | ICD-10-CM | POA: Diagnosis not present

## 2020-04-25 DIAGNOSIS — D696 Thrombocytopenia, unspecified: Secondary | ICD-10-CM | POA: Diagnosis not present

## 2020-04-25 DIAGNOSIS — R279 Unspecified lack of coordination: Secondary | ICD-10-CM | POA: Diagnosis not present

## 2020-04-25 DIAGNOSIS — I501 Left ventricular failure: Secondary | ICD-10-CM | POA: Diagnosis not present

## 2020-04-25 DIAGNOSIS — R0603 Acute respiratory distress: Secondary | ICD-10-CM | POA: Diagnosis not present

## 2020-04-25 DIAGNOSIS — C9000 Multiple myeloma not having achieved remission: Secondary | ICD-10-CM | POA: Diagnosis not present

## 2020-04-25 DIAGNOSIS — I129 Hypertensive chronic kidney disease with stage 1 through stage 4 chronic kidney disease, or unspecified chronic kidney disease: Secondary | ICD-10-CM | POA: Diagnosis not present

## 2020-04-25 DIAGNOSIS — R339 Retention of urine, unspecified: Secondary | ICD-10-CM | POA: Diagnosis not present

## 2020-04-25 DIAGNOSIS — J810 Acute pulmonary edema: Secondary | ICD-10-CM | POA: Diagnosis not present

## 2020-04-25 DIAGNOSIS — A4102 Sepsis due to Methicillin resistant Staphylococcus aureus: Secondary | ICD-10-CM | POA: Diagnosis not present

## 2020-04-25 DIAGNOSIS — D6959 Other secondary thrombocytopenia: Secondary | ICD-10-CM | POA: Diagnosis not present

## 2020-04-25 DIAGNOSIS — I10 Essential (primary) hypertension: Secondary | ICD-10-CM | POA: Diagnosis not present

## 2020-04-25 DIAGNOSIS — J9621 Acute and chronic respiratory failure with hypoxia: Secondary | ICD-10-CM | POA: Diagnosis not present

## 2020-04-25 DIAGNOSIS — J449 Chronic obstructive pulmonary disease, unspecified: Secondary | ICD-10-CM | POA: Diagnosis not present

## 2020-04-25 DIAGNOSIS — J811 Chronic pulmonary edema: Secondary | ICD-10-CM | POA: Diagnosis not present

## 2020-04-25 DIAGNOSIS — E8581 Light chain (AL) amyloidosis: Secondary | ICD-10-CM | POA: Diagnosis not present

## 2020-04-25 DIAGNOSIS — I4891 Unspecified atrial fibrillation: Secondary | ICD-10-CM | POA: Diagnosis not present

## 2020-04-25 DIAGNOSIS — N184 Chronic kidney disease, stage 4 (severe): Secondary | ICD-10-CM | POA: Diagnosis not present

## 2020-04-25 DIAGNOSIS — Z9981 Dependence on supplemental oxygen: Secondary | ICD-10-CM | POA: Diagnosis not present

## 2020-04-25 DIAGNOSIS — J80 Acute respiratory distress syndrome: Secondary | ICD-10-CM | POA: Diagnosis not present

## 2020-04-25 DIAGNOSIS — R5381 Other malaise: Secondary | ICD-10-CM | POA: Diagnosis not present

## 2020-04-25 DIAGNOSIS — J17 Pneumonia in diseases classified elsewhere: Secondary | ICD-10-CM | POA: Diagnosis not present

## 2020-04-25 DIAGNOSIS — R0781 Pleurodynia: Secondary | ICD-10-CM | POA: Diagnosis not present

## 2020-04-25 DIAGNOSIS — J439 Emphysema, unspecified: Secondary | ICD-10-CM | POA: Diagnosis not present

## 2020-04-25 DIAGNOSIS — Z20822 Contact with and (suspected) exposure to covid-19: Secondary | ICD-10-CM | POA: Diagnosis not present

## 2020-04-25 DIAGNOSIS — A499 Bacterial infection, unspecified: Secondary | ICD-10-CM | POA: Diagnosis not present

## 2020-04-25 DIAGNOSIS — J15212 Pneumonia due to Methicillin resistant Staphylococcus aureus: Secondary | ICD-10-CM | POA: Diagnosis not present

## 2020-04-25 DIAGNOSIS — R0902 Hypoxemia: Secondary | ICD-10-CM | POA: Diagnosis not present

## 2020-04-25 DIAGNOSIS — C155 Malignant neoplasm of lower third of esophagus: Secondary | ICD-10-CM | POA: Diagnosis not present

## 2020-04-25 DIAGNOSIS — D61818 Other pancytopenia: Secondary | ICD-10-CM | POA: Diagnosis not present

## 2020-04-25 DIAGNOSIS — N189 Chronic kidney disease, unspecified: Secondary | ICD-10-CM | POA: Diagnosis not present

## 2020-04-25 DIAGNOSIS — R0602 Shortness of breath: Secondary | ICD-10-CM | POA: Diagnosis not present

## 2020-04-25 DIAGNOSIS — D649 Anemia, unspecified: Secondary | ICD-10-CM | POA: Diagnosis not present

## 2020-04-25 DIAGNOSIS — C159 Malignant neoplasm of esophagus, unspecified: Secondary | ICD-10-CM | POA: Diagnosis not present

## 2020-04-25 DIAGNOSIS — E785 Hyperlipidemia, unspecified: Secondary | ICD-10-CM | POA: Diagnosis not present

## 2020-04-25 DIAGNOSIS — N179 Acute kidney failure, unspecified: Secondary | ICD-10-CM | POA: Diagnosis not present

## 2020-04-25 DIAGNOSIS — K2270 Barrett's esophagus without dysplasia: Secondary | ICD-10-CM | POA: Diagnosis not present

## 2020-04-28 DIAGNOSIS — I129 Hypertensive chronic kidney disease with stage 1 through stage 4 chronic kidney disease, or unspecified chronic kidney disease: Secondary | ICD-10-CM | POA: Diagnosis not present

## 2020-04-28 DIAGNOSIS — J449 Chronic obstructive pulmonary disease, unspecified: Secondary | ICD-10-CM | POA: Diagnosis not present

## 2020-04-28 DIAGNOSIS — I4891 Unspecified atrial fibrillation: Secondary | ICD-10-CM | POA: Diagnosis not present

## 2020-04-28 DIAGNOSIS — C155 Malignant neoplasm of lower third of esophagus: Secondary | ICD-10-CM | POA: Diagnosis not present

## 2020-04-28 DIAGNOSIS — E8581 Light chain (AL) amyloidosis: Secondary | ICD-10-CM | POA: Diagnosis not present

## 2020-04-28 DIAGNOSIS — E785 Hyperlipidemia, unspecified: Secondary | ICD-10-CM | POA: Diagnosis not present

## 2020-04-28 DIAGNOSIS — N184 Chronic kidney disease, stage 4 (severe): Secondary | ICD-10-CM | POA: Diagnosis not present

## 2020-04-28 DIAGNOSIS — D696 Thrombocytopenia, unspecified: Secondary | ICD-10-CM | POA: Diagnosis not present

## 2020-04-28 DIAGNOSIS — K2270 Barrett's esophagus without dysplasia: Secondary | ICD-10-CM | POA: Diagnosis not present

## 2020-04-28 DIAGNOSIS — C9000 Multiple myeloma not having achieved remission: Secondary | ICD-10-CM | POA: Diagnosis not present

## 2020-05-02 DIAGNOSIS — J9691 Respiratory failure, unspecified with hypoxia: Secondary | ICD-10-CM | POA: Diagnosis not present

## 2020-05-02 DIAGNOSIS — J17 Pneumonia in diseases classified elsewhere: Secondary | ICD-10-CM | POA: Diagnosis not present

## 2020-05-02 DIAGNOSIS — E8770 Fluid overload, unspecified: Secondary | ICD-10-CM | POA: Diagnosis not present

## 2020-05-02 DIAGNOSIS — R7989 Other specified abnormal findings of blood chemistry: Secondary | ICD-10-CM | POA: Diagnosis not present

## 2020-05-02 DIAGNOSIS — Z9981 Dependence on supplemental oxygen: Secondary | ICD-10-CM | POA: Diagnosis not present

## 2020-05-02 DIAGNOSIS — R059 Cough, unspecified: Secondary | ICD-10-CM | POA: Diagnosis not present

## 2020-05-02 DIAGNOSIS — D849 Immunodeficiency, unspecified: Secondary | ICD-10-CM | POA: Diagnosis not present

## 2020-05-02 DIAGNOSIS — J449 Chronic obstructive pulmonary disease, unspecified: Secondary | ICD-10-CM | POA: Diagnosis not present

## 2020-05-02 DIAGNOSIS — J9811 Atelectasis: Secondary | ICD-10-CM | POA: Diagnosis not present

## 2020-05-02 DIAGNOSIS — J189 Pneumonia, unspecified organism: Secondary | ICD-10-CM | POA: Diagnosis not present

## 2020-05-02 DIAGNOSIS — R845 Abnormal microbiological findings in specimens from respiratory organs and thorax: Secondary | ICD-10-CM | POA: Diagnosis not present

## 2020-05-02 DIAGNOSIS — N189 Chronic kidney disease, unspecified: Secondary | ICD-10-CM | POA: Diagnosis not present

## 2020-05-02 DIAGNOSIS — E440 Moderate protein-calorie malnutrition: Secondary | ICD-10-CM | POA: Diagnosis not present

## 2020-05-02 DIAGNOSIS — B49 Unspecified mycosis: Secondary | ICD-10-CM | POA: Diagnosis not present

## 2020-05-02 DIAGNOSIS — J80 Acute respiratory distress syndrome: Secondary | ICD-10-CM | POA: Diagnosis not present

## 2020-05-02 DIAGNOSIS — J90 Pleural effusion, not elsewhere classified: Secondary | ICD-10-CM | POA: Diagnosis not present

## 2020-05-02 DIAGNOSIS — R0602 Shortness of breath: Secondary | ICD-10-CM | POA: Diagnosis not present

## 2020-05-02 DIAGNOSIS — D631 Anemia in chronic kidney disease: Secondary | ICD-10-CM | POA: Diagnosis not present

## 2020-05-02 DIAGNOSIS — I517 Cardiomegaly: Secondary | ICD-10-CM | POA: Diagnosis not present

## 2020-05-02 DIAGNOSIS — N184 Chronic kidney disease, stage 4 (severe): Secondary | ICD-10-CM | POA: Diagnosis not present

## 2020-05-02 DIAGNOSIS — J810 Acute pulmonary edema: Secondary | ICD-10-CM | POA: Diagnosis not present

## 2020-05-02 DIAGNOSIS — D649 Anemia, unspecified: Secondary | ICD-10-CM | POA: Diagnosis not present

## 2020-05-02 DIAGNOSIS — R2689 Other abnormalities of gait and mobility: Secondary | ICD-10-CM | POA: Diagnosis not present

## 2020-05-02 DIAGNOSIS — Z9889 Other specified postprocedural states: Secondary | ICD-10-CM | POA: Diagnosis not present

## 2020-05-02 DIAGNOSIS — R339 Retention of urine, unspecified: Secondary | ICD-10-CM | POA: Diagnosis not present

## 2020-05-02 DIAGNOSIS — A4102 Sepsis due to Methicillin resistant Staphylococcus aureus: Secondary | ICD-10-CM | POA: Diagnosis not present

## 2020-05-02 DIAGNOSIS — I129 Hypertensive chronic kidney disease with stage 1 through stage 4 chronic kidney disease, or unspecified chronic kidney disease: Secondary | ICD-10-CM | POA: Diagnosis not present

## 2020-05-02 DIAGNOSIS — A499 Bacterial infection, unspecified: Secondary | ICD-10-CM | POA: Diagnosis not present

## 2020-05-02 DIAGNOSIS — J9622 Acute and chronic respiratory failure with hypercapnia: Secondary | ICD-10-CM | POA: Diagnosis not present

## 2020-05-02 DIAGNOSIS — I959 Hypotension, unspecified: Secondary | ICD-10-CM | POA: Diagnosis not present

## 2020-05-02 DIAGNOSIS — J984 Other disorders of lung: Secondary | ICD-10-CM | POA: Diagnosis not present

## 2020-05-02 DIAGNOSIS — J9621 Acute and chronic respiratory failure with hypoxia: Secondary | ICD-10-CM | POA: Diagnosis not present

## 2020-05-02 DIAGNOSIS — D61818 Other pancytopenia: Secondary | ICD-10-CM | POA: Diagnosis not present

## 2020-05-02 DIAGNOSIS — R5381 Other malaise: Secondary | ICD-10-CM | POA: Diagnosis not present

## 2020-05-02 DIAGNOSIS — J811 Chronic pulmonary edema: Secondary | ICD-10-CM | POA: Diagnosis not present

## 2020-05-02 DIAGNOSIS — E8581 Light chain (AL) amyloidosis: Secondary | ICD-10-CM | POA: Diagnosis not present

## 2020-05-02 DIAGNOSIS — R530 Neoplastic (malignant) related fatigue: Secondary | ICD-10-CM | POA: Diagnosis not present

## 2020-05-02 DIAGNOSIS — J439 Emphysema, unspecified: Secondary | ICD-10-CM | POA: Diagnosis not present

## 2020-05-02 DIAGNOSIS — B9562 Methicillin resistant Staphylococcus aureus infection as the cause of diseases classified elsewhere: Secondary | ICD-10-CM | POA: Diagnosis not present

## 2020-05-02 DIAGNOSIS — R0603 Acute respiratory distress: Secondary | ICD-10-CM | POA: Diagnosis not present

## 2020-05-02 DIAGNOSIS — I4891 Unspecified atrial fibrillation: Secondary | ICD-10-CM | POA: Diagnosis not present

## 2020-05-02 DIAGNOSIS — C155 Malignant neoplasm of lower third of esophagus: Secondary | ICD-10-CM | POA: Diagnosis not present

## 2020-05-02 DIAGNOSIS — R279 Unspecified lack of coordination: Secondary | ICD-10-CM | POA: Diagnosis not present

## 2020-05-02 DIAGNOSIS — I10 Essential (primary) hypertension: Secondary | ICD-10-CM | POA: Diagnosis not present

## 2020-05-02 DIAGNOSIS — R918 Other nonspecific abnormal finding of lung field: Secondary | ICD-10-CM | POA: Diagnosis not present

## 2020-05-02 DIAGNOSIS — Z8701 Personal history of pneumonia (recurrent): Secondary | ICD-10-CM | POA: Diagnosis not present

## 2020-05-02 DIAGNOSIS — R0902 Hypoxemia: Secondary | ICD-10-CM | POA: Diagnosis not present

## 2020-05-02 DIAGNOSIS — N179 Acute kidney failure, unspecified: Secondary | ICD-10-CM | POA: Diagnosis not present

## 2020-05-02 DIAGNOSIS — J15212 Pneumonia due to Methicillin resistant Staphylococcus aureus: Secondary | ICD-10-CM | POA: Diagnosis not present

## 2020-05-02 DIAGNOSIS — E785 Hyperlipidemia, unspecified: Secondary | ICD-10-CM | POA: Diagnosis not present

## 2020-05-02 DIAGNOSIS — M6281 Muscle weakness (generalized): Secondary | ICD-10-CM | POA: Diagnosis not present

## 2020-05-02 DIAGNOSIS — J441 Chronic obstructive pulmonary disease with (acute) exacerbation: Secondary | ICD-10-CM | POA: Diagnosis not present

## 2020-05-02 DIAGNOSIS — K560 Paralytic ileus: Secondary | ICD-10-CM | POA: Diagnosis not present

## 2020-05-02 DIAGNOSIS — D6959 Other secondary thrombocytopenia: Secondary | ICD-10-CM | POA: Diagnosis not present

## 2020-05-02 DIAGNOSIS — Z20822 Contact with and (suspected) exposure to covid-19: Secondary | ICD-10-CM | POA: Diagnosis not present

## 2020-05-02 DIAGNOSIS — C9000 Multiple myeloma not having achieved remission: Secondary | ICD-10-CM | POA: Diagnosis not present

## 2020-05-02 DIAGNOSIS — J9692 Respiratory failure, unspecified with hypercapnia: Secondary | ICD-10-CM | POA: Diagnosis not present

## 2020-05-19 DIAGNOSIS — B49 Unspecified mycosis: Secondary | ICD-10-CM | POA: Diagnosis not present

## 2020-05-19 DIAGNOSIS — D6959 Other secondary thrombocytopenia: Secondary | ICD-10-CM | POA: Diagnosis not present

## 2020-05-19 DIAGNOSIS — R339 Retention of urine, unspecified: Secondary | ICD-10-CM | POA: Diagnosis not present

## 2020-05-19 DIAGNOSIS — E859 Amyloidosis, unspecified: Secondary | ICD-10-CM | POA: Diagnosis not present

## 2020-05-19 DIAGNOSIS — J15212 Pneumonia due to Methicillin resistant Staphylococcus aureus: Secondary | ICD-10-CM | POA: Diagnosis not present

## 2020-05-19 DIAGNOSIS — J9811 Atelectasis: Secondary | ICD-10-CM | POA: Diagnosis not present

## 2020-05-19 DIAGNOSIS — R5381 Other malaise: Secondary | ICD-10-CM | POA: Diagnosis not present

## 2020-05-19 DIAGNOSIS — E8581 Light chain (AL) amyloidosis: Secondary | ICD-10-CM | POA: Diagnosis not present

## 2020-05-19 DIAGNOSIS — K22711 Barrett's esophagus with high grade dysplasia: Secondary | ICD-10-CM | POA: Diagnosis not present

## 2020-05-19 DIAGNOSIS — U071 COVID-19: Secondary | ICD-10-CM | POA: Diagnosis not present

## 2020-05-19 DIAGNOSIS — C155 Malignant neoplasm of lower third of esophagus: Secondary | ICD-10-CM | POA: Diagnosis not present

## 2020-05-19 DIAGNOSIS — A499 Bacterial infection, unspecified: Secondary | ICD-10-CM | POA: Diagnosis not present

## 2020-05-19 DIAGNOSIS — J17 Pneumonia in diseases classified elsewhere: Secondary | ICD-10-CM | POA: Diagnosis not present

## 2020-05-19 DIAGNOSIS — F4321 Adjustment disorder with depressed mood: Secondary | ICD-10-CM | POA: Diagnosis not present

## 2020-05-19 DIAGNOSIS — B440 Invasive pulmonary aspergillosis: Secondary | ICD-10-CM | POA: Diagnosis not present

## 2020-05-19 DIAGNOSIS — I2510 Atherosclerotic heart disease of native coronary artery without angina pectoris: Secondary | ICD-10-CM | POA: Diagnosis not present

## 2020-05-19 DIAGNOSIS — C9000 Multiple myeloma not having achieved remission: Secondary | ICD-10-CM | POA: Diagnosis not present

## 2020-05-19 DIAGNOSIS — J9621 Acute and chronic respiratory failure with hypoxia: Secondary | ICD-10-CM | POA: Diagnosis not present

## 2020-05-19 DIAGNOSIS — M6281 Muscle weakness (generalized): Secondary | ICD-10-CM | POA: Diagnosis not present

## 2020-05-19 DIAGNOSIS — I4891 Unspecified atrial fibrillation: Secondary | ICD-10-CM | POA: Diagnosis not present

## 2020-05-19 DIAGNOSIS — R279 Unspecified lack of coordination: Secondary | ICD-10-CM | POA: Diagnosis not present

## 2020-05-19 DIAGNOSIS — J439 Emphysema, unspecified: Secondary | ICD-10-CM | POA: Diagnosis not present

## 2020-05-19 DIAGNOSIS — I129 Hypertensive chronic kidney disease with stage 1 through stage 4 chronic kidney disease, or unspecified chronic kidney disease: Secondary | ICD-10-CM | POA: Diagnosis not present

## 2020-05-19 DIAGNOSIS — R918 Other nonspecific abnormal finding of lung field: Secondary | ICD-10-CM | POA: Diagnosis not present

## 2020-05-19 DIAGNOSIS — J449 Chronic obstructive pulmonary disease, unspecified: Secondary | ICD-10-CM | POA: Diagnosis not present

## 2020-05-19 DIAGNOSIS — J440 Chronic obstructive pulmonary disease with acute lower respiratory infection: Secondary | ICD-10-CM | POA: Diagnosis not present

## 2020-05-19 DIAGNOSIS — N179 Acute kidney failure, unspecified: Secondary | ICD-10-CM | POA: Diagnosis not present

## 2020-05-19 DIAGNOSIS — N184 Chronic kidney disease, stage 4 (severe): Secondary | ICD-10-CM | POA: Diagnosis not present

## 2020-05-19 DIAGNOSIS — R2689 Other abnormalities of gait and mobility: Secondary | ICD-10-CM | POA: Diagnosis not present

## 2020-05-19 DIAGNOSIS — R0602 Shortness of breath: Secondary | ICD-10-CM | POA: Diagnosis not present

## 2020-05-22 DIAGNOSIS — J439 Emphysema, unspecified: Secondary | ICD-10-CM | POA: Diagnosis not present

## 2020-05-22 DIAGNOSIS — J9811 Atelectasis: Secondary | ICD-10-CM | POA: Diagnosis not present

## 2020-05-22 DIAGNOSIS — B49 Unspecified mycosis: Secondary | ICD-10-CM | POA: Diagnosis not present

## 2020-05-22 DIAGNOSIS — R5381 Other malaise: Secondary | ICD-10-CM | POA: Diagnosis not present

## 2020-05-22 DIAGNOSIS — J17 Pneumonia in diseases classified elsewhere: Secondary | ICD-10-CM | POA: Diagnosis not present

## 2020-05-22 DIAGNOSIS — R918 Other nonspecific abnormal finding of lung field: Secondary | ICD-10-CM | POA: Diagnosis not present

## 2020-05-22 DIAGNOSIS — I2510 Atherosclerotic heart disease of native coronary artery without angina pectoris: Secondary | ICD-10-CM | POA: Diagnosis not present

## 2020-05-23 DIAGNOSIS — B440 Invasive pulmonary aspergillosis: Secondary | ICD-10-CM | POA: Diagnosis not present

## 2020-05-23 DIAGNOSIS — R5381 Other malaise: Secondary | ICD-10-CM | POA: Diagnosis not present

## 2020-05-30 DIAGNOSIS — J440 Chronic obstructive pulmonary disease with acute lower respiratory infection: Secondary | ICD-10-CM | POA: Diagnosis not present

## 2020-05-30 DIAGNOSIS — I129 Hypertensive chronic kidney disease with stage 1 through stage 4 chronic kidney disease, or unspecified chronic kidney disease: Secondary | ICD-10-CM | POA: Diagnosis not present

## 2020-05-30 DIAGNOSIS — R339 Retention of urine, unspecified: Secondary | ICD-10-CM | POA: Diagnosis not present

## 2020-05-30 DIAGNOSIS — R5381 Other malaise: Secondary | ICD-10-CM | POA: Diagnosis not present

## 2020-05-30 DIAGNOSIS — J15212 Pneumonia due to Methicillin resistant Staphylococcus aureus: Secondary | ICD-10-CM | POA: Diagnosis not present

## 2020-05-30 DIAGNOSIS — E8581 Light chain (AL) amyloidosis: Secondary | ICD-10-CM | POA: Diagnosis not present

## 2020-05-30 DIAGNOSIS — K22711 Barrett's esophagus with high grade dysplasia: Secondary | ICD-10-CM | POA: Diagnosis not present

## 2020-05-30 DIAGNOSIS — D6959 Other secondary thrombocytopenia: Secondary | ICD-10-CM | POA: Diagnosis not present

## 2020-05-30 DIAGNOSIS — C9000 Multiple myeloma not having achieved remission: Secondary | ICD-10-CM | POA: Diagnosis not present

## 2020-05-30 DIAGNOSIS — E859 Amyloidosis, unspecified: Secondary | ICD-10-CM | POA: Diagnosis not present

## 2020-05-30 DIAGNOSIS — N184 Chronic kidney disease, stage 4 (severe): Secondary | ICD-10-CM | POA: Diagnosis not present

## 2020-06-06 DIAGNOSIS — R5381 Other malaise: Secondary | ICD-10-CM | POA: Diagnosis not present

## 2020-06-06 DIAGNOSIS — U071 COVID-19: Secondary | ICD-10-CM | POA: Diagnosis not present

## 2020-06-09 ENCOUNTER — Other Ambulatory Visit: Payer: Self-pay

## 2020-06-09 NOTE — Patient Outreach (Signed)
[facility]) Care Management  06/09/2020  Jason Harvey December 24, 1945 [id number]   Referral Date: 06/09/20 Referral Source: Unknown Referral Reason: SNF Discharge 06-08-20 [place]   Outreach Attempt: Telephone call to patient. He is able to verify HIPAA.  Discussed referral but patient asked that CM call his son Jason Harvey.  Telephone call to son Jason Harvey.  He states that his Dad is ok today.  Discussed referral and [facility] ongoing support and education. Son states that with appointments to [facility], his local PCP and home Health that is scheduled he declined [facility] ongoing follow up. However, he is agreeable to receive letter for future reference.   Plan: RN CM will send letter and close case at this time.     Jone Baseman, RN, MSN [facility] Care Management Care Management Coordinator Direct Line [phone] Toll Free: [phone]  Fax: [phone]

## 2020-06-15 DIAGNOSIS — J449 Chronic obstructive pulmonary disease, unspecified: Secondary | ICD-10-CM | POA: Diagnosis not present

## 2020-06-15 DIAGNOSIS — I509 Heart failure, unspecified: Secondary | ICD-10-CM | POA: Diagnosis not present

## 2020-06-15 DIAGNOSIS — Z299 Encounter for prophylactic measures, unspecified: Secondary | ICD-10-CM | POA: Diagnosis not present

## 2020-06-15 DIAGNOSIS — Z6832 Body mass index (BMI) 32.0-32.9, adult: Secondary | ICD-10-CM | POA: Diagnosis not present

## 2020-06-15 DIAGNOSIS — R338 Other retention of urine: Secondary | ICD-10-CM | POA: Diagnosis not present

## 2020-06-15 DIAGNOSIS — Z87891 Personal history of nicotine dependence: Secondary | ICD-10-CM | POA: Diagnosis not present

## 2020-06-15 DIAGNOSIS — J189 Pneumonia, unspecified organism: Secondary | ICD-10-CM | POA: Diagnosis not present

## 2020-06-20 DIAGNOSIS — R339 Retention of urine, unspecified: Secondary | ICD-10-CM | POA: Diagnosis not present

## 2020-06-20 DIAGNOSIS — U071 COVID-19: Secondary | ICD-10-CM | POA: Diagnosis not present

## 2020-06-20 DIAGNOSIS — E43 Unspecified severe protein-calorie malnutrition: Secondary | ICD-10-CM | POA: Diagnosis not present

## 2020-06-20 DIAGNOSIS — B965 Pseudomonas (aeruginosa) (mallei) (pseudomallei) as the cause of diseases classified elsewhere: Secondary | ICD-10-CM | POA: Diagnosis not present

## 2020-06-20 DIAGNOSIS — J9621 Acute and chronic respiratory failure with hypoxia: Secondary | ICD-10-CM | POA: Diagnosis not present

## 2020-06-20 DIAGNOSIS — J449 Chronic obstructive pulmonary disease, unspecified: Secondary | ICD-10-CM | POA: Diagnosis not present

## 2020-06-20 DIAGNOSIS — Z8616 Personal history of COVID-19: Secondary | ICD-10-CM | POA: Diagnosis not present

## 2020-06-20 DIAGNOSIS — J90 Pleural effusion, not elsewhere classified: Secondary | ICD-10-CM | POA: Diagnosis not present

## 2020-06-20 DIAGNOSIS — D696 Thrombocytopenia, unspecified: Secondary | ICD-10-CM | POA: Diagnosis not present

## 2020-06-20 DIAGNOSIS — N390 Urinary tract infection, site not specified: Secondary | ICD-10-CM | POA: Diagnosis not present

## 2020-06-20 DIAGNOSIS — R918 Other nonspecific abnormal finding of lung field: Secondary | ICD-10-CM | POA: Diagnosis not present

## 2020-06-20 DIAGNOSIS — B449 Aspergillosis, unspecified: Secondary | ICD-10-CM | POA: Diagnosis not present

## 2020-06-20 DIAGNOSIS — E8581 Light chain (AL) amyloidosis: Secondary | ICD-10-CM | POA: Diagnosis not present

## 2020-06-20 DIAGNOSIS — Z8701 Personal history of pneumonia (recurrent): Secondary | ICD-10-CM | POA: Diagnosis not present

## 2020-06-20 DIAGNOSIS — J1282 Pneumonia due to coronavirus disease 2019: Secondary | ICD-10-CM | POA: Diagnosis not present

## 2020-06-20 DIAGNOSIS — R0600 Dyspnea, unspecified: Secondary | ICD-10-CM | POA: Diagnosis not present

## 2020-06-20 DIAGNOSIS — D6869 Other thrombophilia: Secondary | ICD-10-CM | POA: Diagnosis not present

## 2020-06-20 DIAGNOSIS — I471 Supraventricular tachycardia: Secondary | ICD-10-CM | POA: Diagnosis not present

## 2020-06-20 DIAGNOSIS — R0602 Shortness of breath: Secondary | ICD-10-CM | POA: Diagnosis not present

## 2020-06-29 DIAGNOSIS — J449 Chronic obstructive pulmonary disease, unspecified: Secondary | ICD-10-CM | POA: Diagnosis not present

## 2020-06-29 DIAGNOSIS — R0602 Shortness of breath: Secondary | ICD-10-CM | POA: Diagnosis not present

## 2020-06-30 DIAGNOSIS — Z299 Encounter for prophylactic measures, unspecified: Secondary | ICD-10-CM | POA: Diagnosis not present

## 2020-06-30 DIAGNOSIS — I509 Heart failure, unspecified: Secondary | ICD-10-CM | POA: Diagnosis not present

## 2020-06-30 DIAGNOSIS — R0602 Shortness of breath: Secondary | ICD-10-CM | POA: Diagnosis not present

## 2020-06-30 DIAGNOSIS — Z6832 Body mass index (BMI) 32.0-32.9, adult: Secondary | ICD-10-CM | POA: Diagnosis not present

## 2020-06-30 DIAGNOSIS — Z87891 Personal history of nicotine dependence: Secondary | ICD-10-CM | POA: Diagnosis not present

## 2020-06-30 DIAGNOSIS — I10 Essential (primary) hypertension: Secondary | ICD-10-CM | POA: Diagnosis not present

## 2020-06-30 DIAGNOSIS — D696 Thrombocytopenia, unspecified: Secondary | ICD-10-CM | POA: Diagnosis not present

## 2020-06-30 DIAGNOSIS — J449 Chronic obstructive pulmonary disease, unspecified: Secondary | ICD-10-CM | POA: Diagnosis not present

## 2020-07-04 DIAGNOSIS — I4891 Unspecified atrial fibrillation: Secondary | ICD-10-CM | POA: Diagnosis not present

## 2020-07-04 DIAGNOSIS — J439 Emphysema, unspecified: Secondary | ICD-10-CM | POA: Diagnosis not present

## 2020-07-04 DIAGNOSIS — E43 Unspecified severe protein-calorie malnutrition: Secondary | ICD-10-CM | POA: Diagnosis not present

## 2020-07-04 DIAGNOSIS — J9621 Acute and chronic respiratory failure with hypoxia: Secondary | ICD-10-CM | POA: Diagnosis not present

## 2020-07-04 DIAGNOSIS — U071 COVID-19: Secondary | ICD-10-CM | POA: Diagnosis not present

## 2020-07-04 DIAGNOSIS — N189 Chronic kidney disease, unspecified: Secondary | ICD-10-CM | POA: Diagnosis not present

## 2020-07-04 DIAGNOSIS — I129 Hypertensive chronic kidney disease with stage 1 through stage 4 chronic kidney disease, or unspecified chronic kidney disease: Secondary | ICD-10-CM | POA: Diagnosis not present

## 2020-07-04 DIAGNOSIS — D509 Iron deficiency anemia, unspecified: Secondary | ICD-10-CM | POA: Diagnosis not present

## 2020-07-04 DIAGNOSIS — B441 Other pulmonary aspergillosis: Secondary | ICD-10-CM | POA: Diagnosis not present

## 2020-07-07 DIAGNOSIS — I129 Hypertensive chronic kidney disease with stage 1 through stage 4 chronic kidney disease, or unspecified chronic kidney disease: Secondary | ICD-10-CM | POA: Diagnosis not present

## 2020-07-07 DIAGNOSIS — U071 COVID-19: Secondary | ICD-10-CM | POA: Diagnosis not present

## 2020-07-07 DIAGNOSIS — J439 Emphysema, unspecified: Secondary | ICD-10-CM | POA: Diagnosis not present

## 2020-07-07 DIAGNOSIS — N189 Chronic kidney disease, unspecified: Secondary | ICD-10-CM | POA: Diagnosis not present

## 2020-07-07 DIAGNOSIS — D509 Iron deficiency anemia, unspecified: Secondary | ICD-10-CM | POA: Diagnosis not present

## 2020-07-07 DIAGNOSIS — E43 Unspecified severe protein-calorie malnutrition: Secondary | ICD-10-CM | POA: Diagnosis not present

## 2020-07-07 DIAGNOSIS — B441 Other pulmonary aspergillosis: Secondary | ICD-10-CM | POA: Diagnosis not present

## 2020-07-07 DIAGNOSIS — I4891 Unspecified atrial fibrillation: Secondary | ICD-10-CM | POA: Diagnosis not present

## 2020-07-07 DIAGNOSIS — J9621 Acute and chronic respiratory failure with hypoxia: Secondary | ICD-10-CM | POA: Diagnosis not present

## 2020-07-10 DIAGNOSIS — D500 Iron deficiency anemia secondary to blood loss (chronic): Secondary | ICD-10-CM | POA: Diagnosis not present

## 2020-07-10 DIAGNOSIS — C9000 Multiple myeloma not having achieved remission: Secondary | ICD-10-CM | POA: Diagnosis not present

## 2020-07-10 DIAGNOSIS — N1832 Chronic kidney disease, stage 3b: Secondary | ICD-10-CM | POA: Diagnosis not present

## 2020-07-10 DIAGNOSIS — D472 Monoclonal gammopathy: Secondary | ICD-10-CM | POA: Diagnosis not present

## 2020-07-10 DIAGNOSIS — E611 Iron deficiency: Secondary | ICD-10-CM | POA: Diagnosis not present

## 2020-07-10 DIAGNOSIS — B449 Aspergillosis, unspecified: Secondary | ICD-10-CM | POA: Diagnosis not present

## 2020-07-10 DIAGNOSIS — E854 Organ-limited amyloidosis: Secondary | ICD-10-CM | POA: Diagnosis not present

## 2020-07-10 DIAGNOSIS — J449 Chronic obstructive pulmonary disease, unspecified: Secondary | ICD-10-CM | POA: Diagnosis not present

## 2020-07-10 DIAGNOSIS — J9621 Acute and chronic respiratory failure with hypoxia: Secondary | ICD-10-CM | POA: Diagnosis not present

## 2020-07-10 DIAGNOSIS — E859 Amyloidosis, unspecified: Secondary | ICD-10-CM | POA: Diagnosis not present

## 2020-07-10 DIAGNOSIS — N289 Disorder of kidney and ureter, unspecified: Secondary | ICD-10-CM | POA: Diagnosis not present

## 2020-07-10 DIAGNOSIS — D696 Thrombocytopenia, unspecified: Secondary | ICD-10-CM | POA: Diagnosis not present

## 2020-07-10 DIAGNOSIS — K22711 Barrett's esophagus with high grade dysplasia: Secondary | ICD-10-CM | POA: Diagnosis not present

## 2020-07-10 DIAGNOSIS — I4891 Unspecified atrial fibrillation: Secondary | ICD-10-CM | POA: Diagnosis not present

## 2020-07-10 DIAGNOSIS — N29 Other disorders of kidney and ureter in diseases classified elsewhere: Secondary | ICD-10-CM | POA: Diagnosis not present

## 2020-07-13 DIAGNOSIS — R0602 Shortness of breath: Secondary | ICD-10-CM | POA: Diagnosis not present

## 2020-07-13 DIAGNOSIS — R339 Retention of urine, unspecified: Secondary | ICD-10-CM | POA: Diagnosis not present

## 2020-07-13 DIAGNOSIS — N289 Disorder of kidney and ureter, unspecified: Secondary | ICD-10-CM | POA: Diagnosis not present

## 2020-07-13 DIAGNOSIS — D472 Monoclonal gammopathy: Secondary | ICD-10-CM | POA: Diagnosis not present

## 2020-07-13 DIAGNOSIS — D500 Iron deficiency anemia secondary to blood loss (chronic): Secondary | ICD-10-CM | POA: Diagnosis not present

## 2020-07-18 DIAGNOSIS — I4891 Unspecified atrial fibrillation: Secondary | ICD-10-CM | POA: Diagnosis not present

## 2020-07-18 DIAGNOSIS — B441 Other pulmonary aspergillosis: Secondary | ICD-10-CM | POA: Diagnosis not present

## 2020-07-18 DIAGNOSIS — I129 Hypertensive chronic kidney disease with stage 1 through stage 4 chronic kidney disease, or unspecified chronic kidney disease: Secondary | ICD-10-CM | POA: Diagnosis not present

## 2020-07-18 DIAGNOSIS — J9621 Acute and chronic respiratory failure with hypoxia: Secondary | ICD-10-CM | POA: Diagnosis not present

## 2020-07-18 DIAGNOSIS — J439 Emphysema, unspecified: Secondary | ICD-10-CM | POA: Diagnosis not present

## 2020-07-18 DIAGNOSIS — N189 Chronic kidney disease, unspecified: Secondary | ICD-10-CM | POA: Diagnosis not present

## 2020-07-18 DIAGNOSIS — U071 COVID-19: Secondary | ICD-10-CM | POA: Diagnosis not present

## 2020-07-18 DIAGNOSIS — N3000 Acute cystitis without hematuria: Secondary | ICD-10-CM | POA: Diagnosis not present

## 2020-07-18 DIAGNOSIS — E43 Unspecified severe protein-calorie malnutrition: Secondary | ICD-10-CM | POA: Diagnosis not present

## 2020-07-18 DIAGNOSIS — D509 Iron deficiency anemia, unspecified: Secondary | ICD-10-CM | POA: Diagnosis not present

## 2020-07-18 DIAGNOSIS — E8581 Light chain (AL) amyloidosis: Secondary | ICD-10-CM | POA: Diagnosis not present

## 2020-07-18 DIAGNOSIS — B449 Aspergillosis, unspecified: Secondary | ICD-10-CM | POA: Diagnosis not present

## 2020-07-18 DIAGNOSIS — N390 Urinary tract infection, site not specified: Secondary | ICD-10-CM | POA: Diagnosis not present

## 2020-07-18 DIAGNOSIS — J90 Pleural effusion, not elsewhere classified: Secondary | ICD-10-CM | POA: Diagnosis not present

## 2020-07-19 DIAGNOSIS — D509 Iron deficiency anemia, unspecified: Secondary | ICD-10-CM | POA: Diagnosis not present

## 2020-07-19 DIAGNOSIS — I129 Hypertensive chronic kidney disease with stage 1 through stage 4 chronic kidney disease, or unspecified chronic kidney disease: Secondary | ICD-10-CM | POA: Diagnosis not present

## 2020-07-19 DIAGNOSIS — J439 Emphysema, unspecified: Secondary | ICD-10-CM | POA: Diagnosis not present

## 2020-07-19 DIAGNOSIS — N189 Chronic kidney disease, unspecified: Secondary | ICD-10-CM | POA: Diagnosis not present

## 2020-07-19 DIAGNOSIS — I4891 Unspecified atrial fibrillation: Secondary | ICD-10-CM | POA: Diagnosis not present

## 2020-07-19 DIAGNOSIS — B441 Other pulmonary aspergillosis: Secondary | ICD-10-CM | POA: Diagnosis not present

## 2020-07-19 DIAGNOSIS — J9621 Acute and chronic respiratory failure with hypoxia: Secondary | ICD-10-CM | POA: Diagnosis not present

## 2020-07-19 DIAGNOSIS — U071 COVID-19: Secondary | ICD-10-CM | POA: Diagnosis not present

## 2020-07-19 DIAGNOSIS — E43 Unspecified severe protein-calorie malnutrition: Secondary | ICD-10-CM | POA: Diagnosis not present

## 2020-07-24 DIAGNOSIS — B441 Other pulmonary aspergillosis: Secondary | ICD-10-CM | POA: Diagnosis not present

## 2020-07-24 DIAGNOSIS — I4891 Unspecified atrial fibrillation: Secondary | ICD-10-CM | POA: Diagnosis not present

## 2020-07-24 DIAGNOSIS — I129 Hypertensive chronic kidney disease with stage 1 through stage 4 chronic kidney disease, or unspecified chronic kidney disease: Secondary | ICD-10-CM | POA: Diagnosis not present

## 2020-07-24 DIAGNOSIS — U071 COVID-19: Secondary | ICD-10-CM | POA: Diagnosis not present

## 2020-07-24 DIAGNOSIS — J439 Emphysema, unspecified: Secondary | ICD-10-CM | POA: Diagnosis not present

## 2020-07-24 DIAGNOSIS — N189 Chronic kidney disease, unspecified: Secondary | ICD-10-CM | POA: Diagnosis not present

## 2020-07-24 DIAGNOSIS — J9621 Acute and chronic respiratory failure with hypoxia: Secondary | ICD-10-CM | POA: Diagnosis not present

## 2020-07-24 DIAGNOSIS — D509 Iron deficiency anemia, unspecified: Secondary | ICD-10-CM | POA: Diagnosis not present

## 2020-07-24 DIAGNOSIS — E43 Unspecified severe protein-calorie malnutrition: Secondary | ICD-10-CM | POA: Diagnosis not present

## 2020-07-27 DIAGNOSIS — R0602 Shortness of breath: Secondary | ICD-10-CM | POA: Diagnosis not present

## 2020-07-27 DIAGNOSIS — D509 Iron deficiency anemia, unspecified: Secondary | ICD-10-CM | POA: Diagnosis not present

## 2020-07-27 DIAGNOSIS — E43 Unspecified severe protein-calorie malnutrition: Secondary | ICD-10-CM | POA: Diagnosis not present

## 2020-07-27 DIAGNOSIS — J9621 Acute and chronic respiratory failure with hypoxia: Secondary | ICD-10-CM | POA: Diagnosis not present

## 2020-07-27 DIAGNOSIS — I129 Hypertensive chronic kidney disease with stage 1 through stage 4 chronic kidney disease, or unspecified chronic kidney disease: Secondary | ICD-10-CM | POA: Diagnosis not present

## 2020-07-27 DIAGNOSIS — I4891 Unspecified atrial fibrillation: Secondary | ICD-10-CM | POA: Diagnosis not present

## 2020-07-27 DIAGNOSIS — U071 COVID-19: Secondary | ICD-10-CM | POA: Diagnosis not present

## 2020-07-27 DIAGNOSIS — N189 Chronic kidney disease, unspecified: Secondary | ICD-10-CM | POA: Diagnosis not present

## 2020-07-27 DIAGNOSIS — B441 Other pulmonary aspergillosis: Secondary | ICD-10-CM | POA: Diagnosis not present

## 2020-07-27 DIAGNOSIS — J449 Chronic obstructive pulmonary disease, unspecified: Secondary | ICD-10-CM | POA: Diagnosis not present

## 2020-07-27 DIAGNOSIS — J439 Emphysema, unspecified: Secondary | ICD-10-CM | POA: Diagnosis not present

## 2020-07-28 DIAGNOSIS — J449 Chronic obstructive pulmonary disease, unspecified: Secondary | ICD-10-CM | POA: Diagnosis not present

## 2020-07-28 DIAGNOSIS — E8589 Other amyloidosis: Secondary | ICD-10-CM | POA: Diagnosis not present

## 2020-07-28 DIAGNOSIS — D649 Anemia, unspecified: Secondary | ICD-10-CM | POA: Diagnosis not present

## 2020-07-28 DIAGNOSIS — I129 Hypertensive chronic kidney disease with stage 1 through stage 4 chronic kidney disease, or unspecified chronic kidney disease: Secondary | ICD-10-CM | POA: Diagnosis not present

## 2020-07-28 DIAGNOSIS — N184 Chronic kidney disease, stage 4 (severe): Secondary | ICD-10-CM | POA: Diagnosis not present

## 2020-07-28 DIAGNOSIS — J189 Pneumonia, unspecified organism: Secondary | ICD-10-CM | POA: Diagnosis not present

## 2020-07-28 DIAGNOSIS — I890 Lymphedema, not elsewhere classified: Secondary | ICD-10-CM | POA: Diagnosis not present

## 2020-07-28 DIAGNOSIS — R0602 Shortness of breath: Secondary | ICD-10-CM | POA: Diagnosis not present

## 2020-07-28 DIAGNOSIS — N1832 Chronic kidney disease, stage 3b: Secondary | ICD-10-CM | POA: Diagnosis not present

## 2020-07-28 DIAGNOSIS — K22711 Barrett's esophagus with high grade dysplasia: Secondary | ICD-10-CM | POA: Diagnosis not present

## 2020-07-28 DIAGNOSIS — R809 Proteinuria, unspecified: Secondary | ICD-10-CM | POA: Diagnosis not present

## 2020-07-28 DIAGNOSIS — R600 Localized edema: Secondary | ICD-10-CM | POA: Diagnosis not present

## 2020-07-28 DIAGNOSIS — G629 Polyneuropathy, unspecified: Secondary | ICD-10-CM | POA: Diagnosis not present

## 2020-07-28 DIAGNOSIS — C155 Malignant neoplasm of lower third of esophagus: Secondary | ICD-10-CM | POA: Diagnosis not present

## 2020-07-30 DIAGNOSIS — J449 Chronic obstructive pulmonary disease, unspecified: Secondary | ICD-10-CM | POA: Diagnosis not present

## 2020-07-30 DIAGNOSIS — Z20822 Contact with and (suspected) exposure to covid-19: Secondary | ICD-10-CM | POA: Diagnosis not present

## 2020-07-30 DIAGNOSIS — I129 Hypertensive chronic kidney disease with stage 1 through stage 4 chronic kidney disease, or unspecified chronic kidney disease: Secondary | ICD-10-CM | POA: Diagnosis not present

## 2020-07-30 DIAGNOSIS — J029 Acute pharyngitis, unspecified: Secondary | ICD-10-CM | POA: Diagnosis not present

## 2020-07-30 DIAGNOSIS — N184 Chronic kidney disease, stage 4 (severe): Secondary | ICD-10-CM | POA: Diagnosis not present

## 2020-07-30 DIAGNOSIS — N400 Enlarged prostate without lower urinary tract symptoms: Secondary | ICD-10-CM | POA: Diagnosis not present

## 2020-07-30 DIAGNOSIS — I4891 Unspecified atrial fibrillation: Secondary | ICD-10-CM | POA: Diagnosis not present

## 2020-07-30 DIAGNOSIS — R040 Epistaxis: Secondary | ICD-10-CM | POA: Diagnosis not present

## 2020-07-30 DIAGNOSIS — E8581 Light chain (AL) amyloidosis: Secondary | ICD-10-CM | POA: Diagnosis not present

## 2020-07-30 DIAGNOSIS — Z66 Do not resuscitate: Secondary | ICD-10-CM | POA: Diagnosis not present

## 2020-07-31 DIAGNOSIS — B441 Other pulmonary aspergillosis: Secondary | ICD-10-CM | POA: Diagnosis not present

## 2020-07-31 DIAGNOSIS — D649 Anemia, unspecified: Secondary | ICD-10-CM | POA: Diagnosis not present

## 2020-07-31 DIAGNOSIS — D472 Monoclonal gammopathy: Secondary | ICD-10-CM | POA: Diagnosis not present

## 2020-07-31 DIAGNOSIS — D696 Thrombocytopenia, unspecified: Secondary | ICD-10-CM | POA: Diagnosis not present

## 2020-07-31 DIAGNOSIS — I4891 Unspecified atrial fibrillation: Secondary | ICD-10-CM | POA: Diagnosis not present

## 2020-07-31 DIAGNOSIS — R040 Epistaxis: Secondary | ICD-10-CM | POA: Diagnosis not present

## 2020-07-31 DIAGNOSIS — N184 Chronic kidney disease, stage 4 (severe): Secondary | ICD-10-CM | POA: Diagnosis not present

## 2020-08-01 DIAGNOSIS — N184 Chronic kidney disease, stage 4 (severe): Secondary | ICD-10-CM | POA: Diagnosis not present

## 2020-08-01 DIAGNOSIS — R040 Epistaxis: Secondary | ICD-10-CM | POA: Diagnosis not present

## 2020-08-01 DIAGNOSIS — D649 Anemia, unspecified: Secondary | ICD-10-CM | POA: Diagnosis not present

## 2020-08-01 DIAGNOSIS — D696 Thrombocytopenia, unspecified: Secondary | ICD-10-CM | POA: Diagnosis not present

## 2020-08-01 DIAGNOSIS — B441 Other pulmonary aspergillosis: Secondary | ICD-10-CM | POA: Diagnosis not present

## 2020-08-02 DIAGNOSIS — B441 Other pulmonary aspergillosis: Secondary | ICD-10-CM | POA: Diagnosis not present

## 2020-08-02 DIAGNOSIS — R040 Epistaxis: Secondary | ICD-10-CM | POA: Diagnosis not present

## 2020-08-02 DIAGNOSIS — J439 Emphysema, unspecified: Secondary | ICD-10-CM | POA: Diagnosis not present

## 2020-08-02 DIAGNOSIS — D696 Thrombocytopenia, unspecified: Secondary | ICD-10-CM | POA: Diagnosis not present

## 2020-08-02 DIAGNOSIS — D649 Anemia, unspecified: Secondary | ICD-10-CM | POA: Diagnosis not present

## 2020-08-02 DIAGNOSIS — N184 Chronic kidney disease, stage 4 (severe): Secondary | ICD-10-CM | POA: Diagnosis not present

## 2020-08-02 DIAGNOSIS — D472 Monoclonal gammopathy: Secondary | ICD-10-CM | POA: Diagnosis not present

## 2020-08-07 DIAGNOSIS — Z299 Encounter for prophylactic measures, unspecified: Secondary | ICD-10-CM | POA: Diagnosis not present

## 2020-08-07 DIAGNOSIS — E43 Unspecified severe protein-calorie malnutrition: Secondary | ICD-10-CM | POA: Diagnosis not present

## 2020-08-07 DIAGNOSIS — J439 Emphysema, unspecified: Secondary | ICD-10-CM | POA: Diagnosis not present

## 2020-08-07 DIAGNOSIS — I10 Essential (primary) hypertension: Secondary | ICD-10-CM | POA: Diagnosis not present

## 2020-08-07 DIAGNOSIS — I129 Hypertensive chronic kidney disease with stage 1 through stage 4 chronic kidney disease, or unspecified chronic kidney disease: Secondary | ICD-10-CM | POA: Diagnosis not present

## 2020-08-07 DIAGNOSIS — Z6832 Body mass index (BMI) 32.0-32.9, adult: Secondary | ICD-10-CM | POA: Diagnosis not present

## 2020-08-07 DIAGNOSIS — C159 Malignant neoplasm of esophagus, unspecified: Secondary | ICD-10-CM | POA: Diagnosis not present

## 2020-08-07 DIAGNOSIS — D649 Anemia, unspecified: Secondary | ICD-10-CM | POA: Diagnosis not present

## 2020-08-07 DIAGNOSIS — B441 Other pulmonary aspergillosis: Secondary | ICD-10-CM | POA: Diagnosis not present

## 2020-08-07 DIAGNOSIS — N189 Chronic kidney disease, unspecified: Secondary | ICD-10-CM | POA: Diagnosis not present

## 2020-08-07 DIAGNOSIS — D509 Iron deficiency anemia, unspecified: Secondary | ICD-10-CM | POA: Diagnosis not present

## 2020-08-07 DIAGNOSIS — J9621 Acute and chronic respiratory failure with hypoxia: Secondary | ICD-10-CM | POA: Diagnosis not present

## 2020-08-07 DIAGNOSIS — U071 COVID-19: Secondary | ICD-10-CM | POA: Diagnosis not present

## 2020-08-07 DIAGNOSIS — D696 Thrombocytopenia, unspecified: Secondary | ICD-10-CM | POA: Diagnosis not present

## 2020-08-07 DIAGNOSIS — I4891 Unspecified atrial fibrillation: Secondary | ICD-10-CM | POA: Diagnosis not present

## 2020-08-07 DIAGNOSIS — Z87891 Personal history of nicotine dependence: Secondary | ICD-10-CM | POA: Diagnosis not present

## 2020-08-10 DIAGNOSIS — H35372 Puckering of macula, left eye: Secondary | ICD-10-CM | POA: Diagnosis not present

## 2020-08-10 DIAGNOSIS — H401131 Primary open-angle glaucoma, bilateral, mild stage: Secondary | ICD-10-CM | POA: Diagnosis not present

## 2020-08-10 DIAGNOSIS — Z961 Presence of intraocular lens: Secondary | ICD-10-CM | POA: Diagnosis not present

## 2020-08-11 DIAGNOSIS — G4710 Hypersomnia, unspecified: Secondary | ICD-10-CM | POA: Diagnosis not present

## 2020-08-11 DIAGNOSIS — R0602 Shortness of breath: Secondary | ICD-10-CM | POA: Diagnosis not present

## 2020-08-11 DIAGNOSIS — E8581 Light chain (AL) amyloidosis: Secondary | ICD-10-CM | POA: Diagnosis not present

## 2020-08-11 DIAGNOSIS — Z8701 Personal history of pneumonia (recurrent): Secondary | ICD-10-CM | POA: Diagnosis not present

## 2020-08-11 DIAGNOSIS — J449 Chronic obstructive pulmonary disease, unspecified: Secondary | ICD-10-CM | POA: Diagnosis not present

## 2020-08-11 DIAGNOSIS — R9389 Abnormal findings on diagnostic imaging of other specified body structures: Secondary | ICD-10-CM | POA: Diagnosis not present

## 2020-08-15 DIAGNOSIS — B49 Unspecified mycosis: Secondary | ICD-10-CM | POA: Diagnosis not present

## 2020-08-15 DIAGNOSIS — C9000 Multiple myeloma not having achieved remission: Secondary | ICD-10-CM | POA: Diagnosis not present

## 2020-08-15 DIAGNOSIS — M6281 Muscle weakness (generalized): Secondary | ICD-10-CM | POA: Diagnosis not present

## 2020-08-15 DIAGNOSIS — N184 Chronic kidney disease, stage 4 (severe): Secondary | ICD-10-CM | POA: Diagnosis not present

## 2020-08-15 DIAGNOSIS — I517 Cardiomegaly: Secondary | ICD-10-CM | POA: Diagnosis not present

## 2020-08-15 DIAGNOSIS — R279 Unspecified lack of coordination: Secondary | ICD-10-CM | POA: Diagnosis not present

## 2020-08-15 DIAGNOSIS — R059 Cough, unspecified: Secondary | ICD-10-CM | POA: Diagnosis not present

## 2020-08-15 DIAGNOSIS — Z66 Do not resuscitate: Secondary | ICD-10-CM | POA: Diagnosis not present

## 2020-08-15 DIAGNOSIS — Z9981 Dependence on supplemental oxygen: Secondary | ICD-10-CM | POA: Diagnosis not present

## 2020-08-15 DIAGNOSIS — J449 Chronic obstructive pulmonary disease, unspecified: Secondary | ICD-10-CM | POA: Diagnosis not present

## 2020-08-15 DIAGNOSIS — D696 Thrombocytopenia, unspecified: Secondary | ICD-10-CM | POA: Diagnosis not present

## 2020-08-15 DIAGNOSIS — R590 Localized enlarged lymph nodes: Secondary | ICD-10-CM | POA: Diagnosis not present

## 2020-08-15 DIAGNOSIS — J90 Pleural effusion, not elsewhere classified: Secondary | ICD-10-CM | POA: Diagnosis not present

## 2020-08-15 DIAGNOSIS — R0902 Hypoxemia: Secondary | ICD-10-CM | POA: Diagnosis not present

## 2020-08-15 DIAGNOSIS — J810 Acute pulmonary edema: Secondary | ICD-10-CM | POA: Diagnosis not present

## 2020-08-15 DIAGNOSIS — E859 Amyloidosis, unspecified: Secondary | ICD-10-CM | POA: Diagnosis not present

## 2020-08-15 DIAGNOSIS — J441 Chronic obstructive pulmonary disease with (acute) exacerbation: Secondary | ICD-10-CM | POA: Diagnosis not present

## 2020-08-15 DIAGNOSIS — R1311 Dysphagia, oral phase: Secondary | ICD-10-CM | POA: Diagnosis not present

## 2020-08-15 DIAGNOSIS — R2689 Other abnormalities of gait and mobility: Secondary | ICD-10-CM | POA: Diagnosis not present

## 2020-08-15 DIAGNOSIS — Z515 Encounter for palliative care: Secondary | ICD-10-CM | POA: Diagnosis not present

## 2020-08-15 DIAGNOSIS — I361 Nonrheumatic tricuspid (valve) insufficiency: Secondary | ICD-10-CM | POA: Diagnosis not present

## 2020-08-15 DIAGNOSIS — R195 Other fecal abnormalities: Secondary | ICD-10-CM | POA: Diagnosis not present

## 2020-08-15 DIAGNOSIS — B9689 Other specified bacterial agents as the cause of diseases classified elsewhere: Secondary | ICD-10-CM | POA: Diagnosis not present

## 2020-08-15 DIAGNOSIS — D649 Anemia, unspecified: Secondary | ICD-10-CM | POA: Diagnosis not present

## 2020-08-15 DIAGNOSIS — A499 Bacterial infection, unspecified: Secondary | ICD-10-CM | POA: Diagnosis not present

## 2020-08-15 DIAGNOSIS — J9621 Acute and chronic respiratory failure with hypoxia: Secondary | ICD-10-CM | POA: Diagnosis not present

## 2020-08-15 DIAGNOSIS — I272 Pulmonary hypertension, unspecified: Secondary | ICD-10-CM | POA: Diagnosis not present

## 2020-08-15 DIAGNOSIS — E8581 Light chain (AL) amyloidosis: Secondary | ICD-10-CM | POA: Diagnosis not present

## 2020-08-15 DIAGNOSIS — R918 Other nonspecific abnormal finding of lung field: Secondary | ICD-10-CM | POA: Diagnosis not present

## 2020-08-15 DIAGNOSIS — J15212 Pneumonia due to Methicillin resistant Staphylococcus aureus: Secondary | ICD-10-CM | POA: Diagnosis not present

## 2020-08-15 DIAGNOSIS — M7989 Other specified soft tissue disorders: Secondary | ICD-10-CM | POA: Diagnosis not present

## 2020-08-15 DIAGNOSIS — N179 Acute kidney failure, unspecified: Secondary | ICD-10-CM | POA: Diagnosis not present

## 2020-08-15 DIAGNOSIS — J9811 Atelectasis: Secondary | ICD-10-CM | POA: Diagnosis not present

## 2020-08-15 DIAGNOSIS — J189 Pneumonia, unspecified organism: Secondary | ICD-10-CM | POA: Diagnosis not present

## 2020-08-15 DIAGNOSIS — J9601 Acute respiratory failure with hypoxia: Secondary | ICD-10-CM | POA: Diagnosis not present

## 2020-08-15 DIAGNOSIS — R0602 Shortness of breath: Secondary | ICD-10-CM | POA: Diagnosis not present

## 2020-08-15 DIAGNOSIS — A419 Sepsis, unspecified organism: Secondary | ICD-10-CM | POA: Diagnosis not present

## 2020-08-15 DIAGNOSIS — N132 Hydronephrosis with renal and ureteral calculous obstruction: Secondary | ICD-10-CM | POA: Diagnosis not present

## 2020-08-15 DIAGNOSIS — R5381 Other malaise: Secondary | ICD-10-CM | POA: Diagnosis not present

## 2020-08-15 DIAGNOSIS — J811 Chronic pulmonary edema: Secondary | ICD-10-CM | POA: Diagnosis not present

## 2020-08-15 DIAGNOSIS — N1832 Chronic kidney disease, stage 3b: Secondary | ICD-10-CM | POA: Diagnosis not present

## 2020-08-28 DIAGNOSIS — Z515 Encounter for palliative care: Secondary | ICD-10-CM | POA: Diagnosis not present

## 2020-08-28 DIAGNOSIS — N179 Acute kidney failure, unspecified: Secondary | ICD-10-CM | POA: Diagnosis not present

## 2020-08-28 DIAGNOSIS — C155 Malignant neoplasm of lower third of esophagus: Secondary | ICD-10-CM | POA: Diagnosis not present

## 2020-08-28 DIAGNOSIS — R1311 Dysphagia, oral phase: Secondary | ICD-10-CM | POA: Diagnosis not present

## 2020-08-28 DIAGNOSIS — A499 Bacterial infection, unspecified: Secondary | ICD-10-CM | POA: Diagnosis not present

## 2020-08-28 DIAGNOSIS — J15212 Pneumonia due to Methicillin resistant Staphylococcus aureus: Secondary | ICD-10-CM | POA: Diagnosis not present

## 2020-08-28 DIAGNOSIS — I214 Non-ST elevation (NSTEMI) myocardial infarction: Secondary | ICD-10-CM | POA: Diagnosis not present

## 2020-08-28 DIAGNOSIS — J90 Pleural effusion, not elsewhere classified: Secondary | ICD-10-CM | POA: Diagnosis not present

## 2020-08-28 DIAGNOSIS — J449 Chronic obstructive pulmonary disease, unspecified: Secondary | ICD-10-CM | POA: Diagnosis not present

## 2020-08-28 DIAGNOSIS — C9000 Multiple myeloma not having achieved remission: Secondary | ICD-10-CM | POA: Diagnosis not present

## 2020-08-28 DIAGNOSIS — C9002 Multiple myeloma in relapse: Secondary | ICD-10-CM | POA: Diagnosis not present

## 2020-08-28 DIAGNOSIS — J9621 Acute and chronic respiratory failure with hypoxia: Secondary | ICD-10-CM | POA: Diagnosis not present

## 2020-08-28 DIAGNOSIS — M6281 Muscle weakness (generalized): Secondary | ICD-10-CM | POA: Diagnosis not present

## 2020-08-28 DIAGNOSIS — N1832 Chronic kidney disease, stage 3b: Secondary | ICD-10-CM | POA: Diagnosis not present

## 2020-08-28 DIAGNOSIS — D849 Immunodeficiency, unspecified: Secondary | ICD-10-CM | POA: Diagnosis not present

## 2020-08-28 DIAGNOSIS — A414 Sepsis due to anaerobes: Secondary | ICD-10-CM | POA: Diagnosis not present

## 2020-08-28 DIAGNOSIS — R279 Unspecified lack of coordination: Secondary | ICD-10-CM | POA: Diagnosis not present

## 2020-08-28 DIAGNOSIS — N184 Chronic kidney disease, stage 4 (severe): Secondary | ICD-10-CM | POA: Diagnosis not present

## 2020-08-28 DIAGNOSIS — A0472 Enterocolitis due to Clostridium difficile, not specified as recurrent: Secondary | ICD-10-CM | POA: Diagnosis not present

## 2020-08-28 DIAGNOSIS — R0602 Shortness of breath: Secondary | ICD-10-CM | POA: Diagnosis not present

## 2020-08-28 DIAGNOSIS — Z20822 Contact with and (suspected) exposure to covid-19: Secondary | ICD-10-CM | POA: Diagnosis not present

## 2020-08-28 DIAGNOSIS — R5381 Other malaise: Secondary | ICD-10-CM | POA: Diagnosis not present

## 2020-08-28 DIAGNOSIS — R918 Other nonspecific abnormal finding of lung field: Secondary | ICD-10-CM | POA: Diagnosis not present

## 2020-08-28 DIAGNOSIS — L89890 Pressure ulcer of other site, unstageable: Secondary | ICD-10-CM | POA: Diagnosis not present

## 2020-08-28 DIAGNOSIS — J811 Chronic pulmonary edema: Secondary | ICD-10-CM | POA: Diagnosis not present

## 2020-08-28 DIAGNOSIS — R2689 Other abnormalities of gait and mobility: Secondary | ICD-10-CM | POA: Diagnosis not present

## 2020-08-28 DIAGNOSIS — I10 Essential (primary) hypertension: Secondary | ICD-10-CM | POA: Diagnosis not present

## 2020-08-31 DIAGNOSIS — R5381 Other malaise: Secondary | ICD-10-CM | POA: Diagnosis not present

## 2020-09-01 DIAGNOSIS — R918 Other nonspecific abnormal finding of lung field: Secondary | ICD-10-CM | POA: Diagnosis not present

## 2020-09-01 DIAGNOSIS — R52 Pain, unspecified: Secondary | ICD-10-CM | POA: Diagnosis not present

## 2020-09-01 DIAGNOSIS — D472 Monoclonal gammopathy: Secondary | ICD-10-CM | POA: Diagnosis not present

## 2020-09-01 DIAGNOSIS — D693 Immune thrombocytopenic purpura: Secondary | ICD-10-CM | POA: Diagnosis not present

## 2020-09-01 DIAGNOSIS — C7931 Secondary malignant neoplasm of brain: Secondary | ICD-10-CM | POA: Diagnosis not present

## 2020-09-01 DIAGNOSIS — E8581 Light chain (AL) amyloidosis: Secondary | ICD-10-CM | POA: Diagnosis not present

## 2020-09-01 DIAGNOSIS — D649 Anemia, unspecified: Secondary | ICD-10-CM | POA: Diagnosis not present

## 2020-09-01 DIAGNOSIS — J9621 Acute and chronic respiratory failure with hypoxia: Secondary | ICD-10-CM | POA: Diagnosis not present

## 2020-09-01 DIAGNOSIS — J9601 Acute respiratory failure with hypoxia: Secondary | ICD-10-CM | POA: Diagnosis not present

## 2020-09-01 DIAGNOSIS — I214 Non-ST elevation (NSTEMI) myocardial infarction: Secondary | ICD-10-CM | POA: Diagnosis not present

## 2020-09-01 DIAGNOSIS — A0471 Enterocolitis due to Clostridium difficile, recurrent: Secondary | ICD-10-CM | POA: Diagnosis not present

## 2020-09-01 DIAGNOSIS — D696 Thrombocytopenia, unspecified: Secondary | ICD-10-CM | POA: Diagnosis not present

## 2020-09-01 DIAGNOSIS — C269 Malignant neoplasm of ill-defined sites within the digestive system: Secondary | ICD-10-CM | POA: Diagnosis not present

## 2020-09-01 DIAGNOSIS — L89890 Pressure ulcer of other site, unstageable: Secondary | ICD-10-CM | POA: Diagnosis not present

## 2020-09-01 DIAGNOSIS — Z743 Need for continuous supervision: Secondary | ICD-10-CM | POA: Diagnosis not present

## 2020-09-01 DIAGNOSIS — J811 Chronic pulmonary edema: Secondary | ICD-10-CM | POA: Diagnosis not present

## 2020-09-01 DIAGNOSIS — Z20822 Contact with and (suspected) exposure to covid-19: Secondary | ICD-10-CM | POA: Diagnosis not present

## 2020-09-01 DIAGNOSIS — A0472 Enterocolitis due to Clostridium difficile, not specified as recurrent: Secondary | ICD-10-CM | POA: Diagnosis not present

## 2020-09-01 DIAGNOSIS — Z8614 Personal history of Methicillin resistant Staphylococcus aureus infection: Secondary | ICD-10-CM | POA: Diagnosis not present

## 2020-09-01 DIAGNOSIS — R140 Abdominal distension (gaseous): Secondary | ICD-10-CM | POA: Diagnosis not present

## 2020-09-01 DIAGNOSIS — K562 Volvulus: Secondary | ICD-10-CM | POA: Diagnosis not present

## 2020-09-01 DIAGNOSIS — I4891 Unspecified atrial fibrillation: Secondary | ICD-10-CM | POA: Diagnosis not present

## 2020-09-01 DIAGNOSIS — J918 Pleural effusion in other conditions classified elsewhere: Secondary | ICD-10-CM | POA: Diagnosis not present

## 2020-09-01 DIAGNOSIS — J189 Pneumonia, unspecified organism: Secondary | ICD-10-CM | POA: Diagnosis not present

## 2020-09-01 DIAGNOSIS — C9002 Multiple myeloma in relapse: Secondary | ICD-10-CM | POA: Diagnosis not present

## 2020-09-01 DIAGNOSIS — R778 Other specified abnormalities of plasma proteins: Secondary | ICD-10-CM | POA: Diagnosis not present

## 2020-09-01 DIAGNOSIS — R5381 Other malaise: Secondary | ICD-10-CM | POA: Diagnosis not present

## 2020-09-01 DIAGNOSIS — C7951 Secondary malignant neoplasm of bone: Secondary | ICD-10-CM | POA: Diagnosis not present

## 2020-09-01 DIAGNOSIS — A414 Sepsis due to anaerobes: Secondary | ICD-10-CM | POA: Diagnosis not present

## 2020-09-01 DIAGNOSIS — J449 Chronic obstructive pulmonary disease, unspecified: Secondary | ICD-10-CM | POA: Diagnosis not present

## 2020-09-01 DIAGNOSIS — Z859 Personal history of malignant neoplasm, unspecified: Secondary | ICD-10-CM | POA: Diagnosis not present

## 2020-09-01 DIAGNOSIS — C801 Malignant (primary) neoplasm, unspecified: Secondary | ICD-10-CM | POA: Diagnosis not present

## 2020-09-01 DIAGNOSIS — C7952 Secondary malignant neoplasm of bone marrow: Secondary | ICD-10-CM | POA: Diagnosis not present

## 2020-09-01 DIAGNOSIS — N179 Acute kidney failure, unspecified: Secondary | ICD-10-CM | POA: Diagnosis not present

## 2020-09-01 DIAGNOSIS — Z515 Encounter for palliative care: Secondary | ICD-10-CM | POA: Diagnosis not present

## 2020-09-01 DIAGNOSIS — R0602 Shortness of breath: Secondary | ICD-10-CM | POA: Diagnosis not present

## 2020-09-01 DIAGNOSIS — C9000 Multiple myeloma not having achieved remission: Secondary | ICD-10-CM | POA: Diagnosis not present

## 2020-09-01 DIAGNOSIS — R6889 Other general symptoms and signs: Secondary | ICD-10-CM | POA: Diagnosis not present

## 2020-09-01 DIAGNOSIS — J90 Pleural effusion, not elsewhere classified: Secondary | ICD-10-CM | POA: Diagnosis not present

## 2020-09-01 DIAGNOSIS — C155 Malignant neoplasm of lower third of esophagus: Secondary | ICD-10-CM | POA: Diagnosis not present

## 2020-09-01 DIAGNOSIS — M899 Disorder of bone, unspecified: Secondary | ICD-10-CM | POA: Diagnosis not present

## 2020-09-01 DIAGNOSIS — D849 Immunodeficiency, unspecified: Secondary | ICD-10-CM | POA: Diagnosis not present

## 2020-09-01 DIAGNOSIS — I340 Nonrheumatic mitral (valve) insufficiency: Secondary | ICD-10-CM | POA: Diagnosis not present

## 2020-09-01 DIAGNOSIS — R29890 Loss of height: Secondary | ICD-10-CM | POA: Diagnosis not present

## 2020-09-01 DIAGNOSIS — A419 Sepsis, unspecified organism: Secondary | ICD-10-CM | POA: Diagnosis not present

## 2020-09-01 DIAGNOSIS — C799 Secondary malignant neoplasm of unspecified site: Secondary | ICD-10-CM | POA: Diagnosis not present

## 2020-09-01 DIAGNOSIS — E854 Organ-limited amyloidosis: Secondary | ICD-10-CM | POA: Diagnosis not present

## 2020-09-01 DIAGNOSIS — I10 Essential (primary) hypertension: Secondary | ICD-10-CM | POA: Diagnosis not present

## 2020-09-26 DIAGNOSIS — R0602 Shortness of breath: Secondary | ICD-10-CM | POA: Diagnosis not present

## 2020-09-26 DIAGNOSIS — J449 Chronic obstructive pulmonary disease, unspecified: Secondary | ICD-10-CM | POA: Diagnosis not present

## 2020-09-27 DIAGNOSIS — J449 Chronic obstructive pulmonary disease, unspecified: Secondary | ICD-10-CM | POA: Diagnosis not present

## 2020-09-27 DIAGNOSIS — R0602 Shortness of breath: Secondary | ICD-10-CM | POA: Diagnosis not present

## 2020-10-18 DEATH — deceased
